# Patient Record
Sex: Male | Born: 1960 | Race: White | Hispanic: No | Marital: Married | Smoking: Current every day smoker
Health system: Southern US, Community
[De-identification: ages and names within clinical notes are randomized; demographics above are authoritative.]

## PROBLEM LIST (undated history)

## (undated) DIAGNOSIS — K449 Diaphragmatic hernia without obstruction or gangrene: Secondary | ICD-10-CM

## (undated) DIAGNOSIS — M1990 Unspecified osteoarthritis, unspecified site: Secondary | ICD-10-CM

## (undated) DIAGNOSIS — K635 Polyp of colon: Secondary | ICD-10-CM

## (undated) DIAGNOSIS — E7800 Pure hypercholesterolemia, unspecified: Secondary | ICD-10-CM

## (undated) HISTORY — DX: Pure hypercholesterolemia, unspecified: E78.00

## (undated) HISTORY — DX: Diaphragmatic hernia without obstruction or gangrene: K44.9

## (undated) HISTORY — DX: Unspecified osteoarthritis, unspecified site: M19.90

## (undated) HISTORY — PX: VASECTOMY: SHX75

## (undated) HISTORY — DX: Polyp of colon: K63.5

## (undated) HISTORY — PX: APPENDECTOMY: SHX54

## (undated) HISTORY — PX: COLONOSCOPY: SHX174

---

## 1997-09-30 ENCOUNTER — Emergency Department (HOSPITAL_COMMUNITY): Admission: EM | Admit: 1997-09-30 | Discharge: 1997-09-30 | Payer: Self-pay | Admitting: Emergency Medicine

## 2004-07-28 ENCOUNTER — Encounter: Admission: RE | Admit: 2004-07-28 | Discharge: 2004-07-28 | Payer: Self-pay | Admitting: *Deleted

## 2004-07-29 ENCOUNTER — Ambulatory Visit (HOSPITAL_COMMUNITY): Admission: RE | Admit: 2004-07-29 | Discharge: 2004-07-29 | Payer: Self-pay | Admitting: *Deleted

## 2006-07-19 ENCOUNTER — Ambulatory Visit: Payer: Self-pay | Admitting: Gastroenterology

## 2006-07-30 ENCOUNTER — Ambulatory Visit: Payer: Self-pay | Admitting: Gastroenterology

## 2006-07-30 ENCOUNTER — Encounter (INDEPENDENT_AMBULATORY_CARE_PROVIDER_SITE_OTHER): Payer: Self-pay | Admitting: Specialist

## 2006-09-06 ENCOUNTER — Ambulatory Visit: Payer: Self-pay | Admitting: Gastroenterology

## 2010-09-09 NOTE — Assessment & Plan Note (Signed)
[facility]                         GASTROENTEROLOGY OFFICE NOTE   ARTICE, BERGERSON                     MRN:          [id number]  DATE:09/06/2006                            DOB:          Sep 08, 1960    PROBLEM:  Abdominal pain.   Mr. Mason Ferguson has returned for reevaluation.  Upper endoscopy demonstrated  erosive esophagitis.  He also has gastritis.  Biopsies were positive for  H. pylori, for which he underwent therapy with a Prevpac.  After  completing his 2-week course of antibiotics, Mr. Mason Ferguson reports complete  resolution of his abdominal pain.  He currently has no GI complaints.   PHYSICAL EXAMINATION:  VITAL SIGNS:  Pulse 80, blood pressure 118/82,  weight 186.   IMPRESSION:  Abdominal pain -- secondary to gastritis and probably  secondary to erosive esophagitis.   RECOMMENDATIONS:  Mr. Mason Ferguson is reluctant to take any more medicine,  since he is symptom-free.  I carefully instructed him to resume PPI  therapy, should he have recurrent pain or reflux.     Barbette Hair. Arlyce Dice, MD,FACG  Electronically Signed    RDK/MedQ  DD: 09/06/2006  DT: 09/07/2006  Job #: [id number]

## 2010-09-12 NOTE — Cardiovascular Report (Signed)
NAMEDARNEL, MCHAN NO.:  [id number]   MEDICAL RECORD NO.:  [id number]          PATIENT TYPE:  OIB   LOCATION:  2899                         FACILITY:  [facility]   PHYSICIAN:  Meade Maw, M.D.    DATE OF BIRTH:  16-Nov-1960   DATE OF PROCEDURE:  07/29/2004  DATE OF DISCHARGE:                              CARDIAC CATHETERIZATION   INDICATIONS FOR PROCEDURE:  This is a 50 year old gentleman with ongoing  chest pain which was described as midsternal chest pain radiating to the  left arm, severe in nature.  He underwent a stress Cardiolite which revealed  minimal decreased uptake in the anteroseptal region.  There was no  significant reversal.  He had good exercise tolerance and the patient did  have chest pain with distress portion.  In view of his ongoing chest pain,  it was elected to proceed with left heart catheterization.   DESCRIPTION OF PROCEDURE:  After obtaining written informed consent, the  patient was brought to the cardiac catheterization lab in the postabsorptive  state.  Preoperative sedation was achieved using Versed 1 mg IV.  The right  groin was prepped and draped in the usual sterile fashion.  Local anesthesia  was achieved using 1% Xylocaine.  A 6 French hemostasis sheath was placed  into the right femoral artery using a modified Seldinger technique.  Selective coronary angiography was performed using a JL4 and JR4 Judkins  catheter.  Multiple views were obtained.  All catheter exchanges were made  over a guide wire.  Single plane ventriculogram was performed in the RAO  position using a 6 French pigtail curved catheter.  There was no immediate  complications.  There was no identifiable disease.  The patient was  transferred to the holding area.  The hemostasis sheath was removed.  Hemostasis was achieved using a fem-stop device.   FINDINGS:  The aortic pressure was 109/77, LV pressure was 109, EDP is 18.   Single plane ventriculogram revealed  normal wall motion, ejection fraction  of 65%.   CORONARY ANGIOGRAPHY:  1.  The left main coronary artery bifurcates into the left anterior      descending and circumflex vessel.  There is no disease noted in the left      main coronary artery.  2.  Left anterior descending gives rise to trivial D1 and D2, small D3, and      a larger D4.  The left anterior descending goes on to end as an apical      branch. There is no disease noted in the left anterior descending or its      branches.  3.  Circumflex is a large caliber vessel giving rise to trivial OM1, OM2,      there is a large OM3.  The circumflex then goes on to end as a      posterolateral branch. There is no disease noted in the circumflex or      its branches.  4.  Right coronary artery is large dominant artery giving rise to a small to      moderate RV marginal  I, marginal II.  The right coronary artery gives      rise to PDA and PL branch.  There is no disease noted in the right      coronary artery or its branches.   IMPRESSION:  Normal coronary angiography, normal single plane  ventriculogram.  Other etiologies for his chest pain should be considered.      HP/MEDQ  D:  07/29/2004  T:  07/29/2004  Job:  [id number]   cc:   Tally Joe, M.D.  [address] [place] [address]  [place], [place] [address]  Fax: [phone]

## 2014-04-27 HISTORY — PX: ROTATOR CUFF REPAIR: SHX139

## 2020-06-17 DIAGNOSIS — M542 Cervicalgia: Secondary | ICD-10-CM | POA: Diagnosis not present

## 2020-06-17 DIAGNOSIS — M25512 Pain in left shoulder: Secondary | ICD-10-CM | POA: Diagnosis not present

## 2020-06-17 DIAGNOSIS — Z8249 Family history of ischemic heart disease and other diseases of the circulatory system: Secondary | ICD-10-CM | POA: Diagnosis not present

## 2020-06-17 DIAGNOSIS — R9431 Abnormal electrocardiogram [ECG] [EKG]: Secondary | ICD-10-CM | POA: Diagnosis not present

## 2020-07-19 ENCOUNTER — Other Ambulatory Visit: Payer: Self-pay

## 2020-07-19 ENCOUNTER — Encounter: Payer: Self-pay | Admitting: Cardiology

## 2020-07-19 ENCOUNTER — Ambulatory Visit: Payer: BC Managed Care – PPO | Admitting: Cardiology

## 2020-07-19 VITALS — BP 127/76 | HR 65 | Temp 98.6°F | Resp 17 | Ht 73.0 in | Wt 175.4 lb

## 2020-07-19 DIAGNOSIS — I208 Other forms of angina pectoris: Secondary | ICD-10-CM | POA: Diagnosis not present

## 2020-07-19 DIAGNOSIS — Z8249 Family history of ischemic heart disease and other diseases of the circulatory system: Secondary | ICD-10-CM | POA: Diagnosis not present

## 2020-07-19 DIAGNOSIS — E782 Mixed hyperlipidemia: Secondary | ICD-10-CM | POA: Diagnosis not present

## 2020-07-19 DIAGNOSIS — R1900 Intra-abdominal and pelvic swelling, mass and lump, unspecified site: Secondary | ICD-10-CM

## 2020-07-19 DIAGNOSIS — R9431 Abnormal electrocardiogram [ECG] [EKG]: Secondary | ICD-10-CM | POA: Diagnosis not present

## 2020-07-19 DIAGNOSIS — F17210 Nicotine dependence, cigarettes, uncomplicated: Secondary | ICD-10-CM

## 2020-07-19 MED ORDER — BUPROPION HCL ER (SR) 150 MG PO TB12: 150.0000 mg | ORAL_TABLET | Freq: Two times a day (BID) | ORAL | 3 refills | Status: DC

## 2020-07-19 NOTE — Progress Notes (Signed)
Primary Physician/Referring:  Shanon Rosser, PA-C  Patient ID: Mason Ferguson, male    DOB: 1960/12/05, 60 y.o.   MRN: [id number]  Chief Complaint  Patient presents with  . Abnormal ECG    Ref by Shanon Rosser   HPI:    Mason Ferguson  is a 60 y.o. Caucasian male with current daily smoking, hyperlipidemia, Raynaud's disease, rheumatoid arthritis referred to me for evaluation of left arm pain on exertion suggestive of angina pectoris and abnormal EKG with T wave inversions in V1 V2 done on 06/17/2020.  Patient has family history of early CAD, father and brother had CABG in their 68s, both his father and his brother also had abdominal aortic aneurysm.   Patient underwent coronary angiogram in 2006, with normal coronary anatomy and no significant coronary artery disease.  Patient reports approximately 3 weeks ago he woke up with sudden onset left shoulder and neck pain.  Patient received no relief taking ibuprofen.  The pain persisted and he therefore went to urgent care approximately 5 hours after onset of pain.  On urgent care they reportedly told the patient he had a bone spur following a x-ray of the cervical spine, which they stated was the source of his discomfort.  Unfortunately these records are available for review.  Patient was treated by urgent care provider with muscle relaxer and Tylenol, and pain relieved approximately 24 hours later.  He has had no recurrence of left arm or neck pain in the last 1/2 weeks.  He does have right shoulder pain, which is chronic.   History reviewed. No pertinent past medical history. Past Surgical History:  Procedure Laterality Date  . APPENDECTOMY    . ROTATOR CUFF REPAIR  2016  . VASECTOMY     Family History  Problem Relation Age of Onset  . Heart attack Mother   . Stroke Mother   . Heart disease Father   . AAA (abdominal aortic aneurysm) Father   . Diabetes Sister   . Dementia Sister     Social History   Tobacco Use  . Smoking status:  Current Every Day Smoker    Packs/day: 1.00    Years: 50.00    Pack years: 50.00    Types: Cigarettes  . Smokeless tobacco: Never Used  Substance Use Topics  . Alcohol use: Not Currently   Marital Status: Married  ROS  Review of Systems  Constitutional: Negative for malaise/fatigue and weight gain.  Cardiovascular: Negative for chest pain, claudication, leg swelling, near-syncope, orthopnea, palpitations, paroxysmal nocturnal dyspnea and syncope.  Respiratory: Negative for shortness of breath.   Hematologic/Lymphatic: Does not bruise/bleed easily.  Musculoskeletal: Positive for arthritis and joint pain (right shoulder).  Gastrointestinal: Negative for melena.  Neurological: Negative for dizziness and weakness.   Objective  Blood pressure 127/76, pulse 65, temperature 98.6 F (37 C), temperature source Temporal, resp. rate 17, height 6' 1"  (1.854 m), weight 175 lb 6.4 oz (79.6 kg), SpO2 99 %.  Vitals with BMI 07/19/2020  Height 6' 1"   Weight 175 lbs 6 oz  BMI 40.98  Systolic 119  Diastolic 76  Pulse 65     Physical Exam Vitals reviewed.  HENT:     Head: Normocephalic and atraumatic.  Cardiovascular:     Rate and Rhythm: Normal rate and regular rhythm.     Pulses: Intact distal pulses.     Heart sounds: S1 normal and S2 normal. No murmur heard. No gallop.   Pulmonary:     Effort:  Pulmonary effort is normal. No respiratory distress.     Breath sounds: No wheezing, rhonchi or rales.  Abdominal:     Palpations: There is pulsatile mass.  Musculoskeletal:     Right lower leg: No edema.     Left lower leg: No edema.  Neurological:     General: No focal deficit present.     Mental Status: He is alert and oriented to person, place, and time.  Psychiatric:        Mood and Affect: Mood normal.        Behavior: Behavior normal.    Laboratory examination:   No results for input(s): NA, K, CL, CO2, GLUCOSE, BUN, CREATININE, CALCIUM, GFRNONAA, GFRAA in the last 8760  hours. CrCl cannot be calculated (No successful lab value found.).  No flowsheet data found. No flowsheet data found.  Lipid Panel No results for input(s): CHOL, TRIG, LDLCALC, VLDL, HDL, CHOLHDL, LDLDIRECT in the last 8760 hours. Lipid Panel  No results found for: CHOL, TRIG, HDL, CHOLHDL, VLDL, LDLCALC, LDLDIRECT, LABVLDL   HEMOGLOBIN A1C No results found for: HGBA1C, MPG TSH No results for input(s): TSH in the last 8760 hours.  External labs:   None  Medications and allergies  No Known Allergies   No outpatient medications prior to visit.   No facility-administered medications prior to visit.    Radiology:   No results found.   Cardiac Studies:   Coronary angiogram 07/29/2004:  1.  The left main coronary artery bifurcates into the left anterior        descending and circumflex vessel.  There is no disease noted in the left      main coronary artery.  2.  Left anterior descending gives rise to trivial D1 and D2, small D3, and      a larger D4.  The left anterior descending goes on to end as an apical      branch. There is no disease noted in the left anterior descending or its      branches.  3.  Circumflex is a large caliber vessel giving rise to trivial OM1, OM2,      there is a large OM3.  The circumflex then goes on to end as a      posterolateral branch. There is no disease noted in the circumflex or      its branches.  4.  Right coronary artery is large dominant artery giving rise to a small to      moderate RV marginal I, marginal II.  The right coronary artery gives      rise to PDA and PL branch.  There is no disease noted in the right      coronary artery or its branches.   IMPRESSION:  Normal coronary angiography, normal single plane  ventriculogram.  Other etiologies for his chest pain should be considered.  EKG:   07/19/2020: Sinus rhythm at a rate of 63 bpm.  Left atrial enlargement.  Normal axis.  T wave inversion in V1, likely normal variant.    05/28/2020: Normal sinus rhythm with rate of 65 bpm, normal axis, T wave inversion in V1 and V2, probably normal variant.  Assessment     ICD-10-CM   1. Anginal equivalent (HCC)  I20.8 PCV ECHOCARDIOGRAM COMPLETE    PCV MYOCARDIAL PERFUSION WO LEXISCAN    CMP14+EGFR    TSH    CBC  2. Family history of early CAD  Z97.49   3. Tobacco dependence due to cigarettes  F17.210   4. Pulsatile abdominal mass  R19.00 PCV AORTA DUPLEX  5. Nonspecific abnormal electrocardiogram (ECG) (EKG)  R94.31 EKG 12-Lead  6. Mixed hyperlipidemia  E78.2 Lipid Panel With LDL/HDL Ratio     There are no discontinued medications.  Meds ordered this encounter  Medications  . buPROPion (WELLBUTRIN SR) 150 MG 12 hr tablet    Sig: Take 1 tablet (150 mg total) by mouth 2 (two) times daily. 150 mg once daily for 3 days, then increase to twice daily. Plan for target quit date, 1 week following starting bupropion.    Dispense:  60 tablet    Refill:  3   Orders Placed This Encounter  Procedures  . CMP14+EGFR  . Lipid Panel With LDL/HDL Ratio  . TSH  . CBC  . PCV MYOCARDIAL PERFUSION WO LEXISCAN    Standing Status:   Future    Standing Expiration Date:   09/18/2020  . EKG 12-Lead  . PCV ECHOCARDIOGRAM COMPLETE    Standing Status:   Future    Standing Expiration Date:   07/19/2021    Recommendations:   Mason Ferguson is a 60 y.o. Caucasian male with current daily smoking, hyperlipidemia, Raynaud's disease, rheumatoid arthritis referred to me for evaluation of left arm pain on exertion suggestive of angina pectoris and abnormal EKG with T wave inversions in V1 V2 done on 06/17/2020.  Patient has family history of early CAD, father and brother had CABG in their 67s, both his father and his brother also had abdominal aortic aneurysm.   Patient underwent coronary angiogram in 2006, with normal coronary anatomy and no significant coronary artery disease.  Patient's symptoms of left arm pain and neck pain, are  suggestive of musculoskeletal etiology, however patient has multiple cardiovascular risk factors including current tobacco use, hyperlipidemia, family history of early CAD.  We will therefore schedule patient for echocardiogram and nuclear stress test.  On exam patient also has pulsatile abdominal mass, in view of this as well as patient's family history of abdominal aortic aneurysm, will obtain aortic duplex.  Patient has no recent labs for review as he does not follow regularly with PCP or other providers.  We will therefore obtain basic labs including CMP, TSH, CBC, lipid profile testing.  Patient has highly motivated to stop smoking, he is used bupropion in the past and requests that he try this again.  I have therefore prescribed bupropion for smoking cessation.  Counseled patient regarding use as well as side effects, patient will notify our office if he experiences any issues.  Blood pressure is well controlled.  Patient was referred with concerns of abnormal EKG revealing T wave inversions in V1 and V2 on 06/17/2020.  EKG today does show T wave inversions in V1, this is probably normal variant.  Follow-up in 8 weeks for results of cardiac testing.  Patient was seen in collaboration with Dr. Einar Gip. He also reviewed patient's chart and examined the patient. Dr. Einar Gip is in agreement of the plan.     Alethia Berthold, PA-C 07/22/2020, 10:20 AM Office: [phone]

## 2020-07-29 ENCOUNTER — Ambulatory Visit: Payer: BC Managed Care – PPO

## 2020-07-29 ENCOUNTER — Other Ambulatory Visit: Payer: Self-pay

## 2020-07-29 DIAGNOSIS — I208 Other forms of angina pectoris: Secondary | ICD-10-CM

## 2020-08-05 NOTE — Progress Notes (Signed)
Please inform patient stress test was low risk, will discuss further at next office visit.

## 2020-08-06 NOTE — Progress Notes (Signed)
Called and spoke with patient regarding his stress test results.

## 2020-08-21 ENCOUNTER — Ambulatory Visit: Payer: BC Managed Care – PPO

## 2020-08-21 ENCOUNTER — Other Ambulatory Visit: Payer: BC Managed Care – PPO

## 2020-08-21 ENCOUNTER — Other Ambulatory Visit: Payer: Self-pay

## 2020-08-21 DIAGNOSIS — E782 Mixed hyperlipidemia: Secondary | ICD-10-CM | POA: Diagnosis not present

## 2020-08-21 DIAGNOSIS — I208 Other forms of angina pectoris: Secondary | ICD-10-CM | POA: Diagnosis not present

## 2020-08-21 DIAGNOSIS — R1900 Intra-abdominal and pelvic swelling, mass and lump, unspecified site: Secondary | ICD-10-CM

## 2020-08-22 LAB — CMP14+EGFR
ALT: 8 IU/L (ref 0–44)
AST: 13 IU/L (ref 0–40)
Albumin/Globulin Ratio: 1.9 (ref 1.2–2.2)
Albumin: 4.9 g/dL — ABNORMAL HIGH (ref 3.8–4.8)
Alkaline Phosphatase: 144 IU/L — ABNORMAL HIGH (ref 44–121)
BUN/Creatinine Ratio: 11 (ref 10–24)
BUN: 11 mg/dL (ref 8–27)
Bilirubin Total: 0.2 mg/dL (ref 0.0–1.2)
CO2: 24 mmol/L (ref 20–29)
Calcium: 9.7 mg/dL (ref 8.6–10.2)
Chloride: 103 mmol/L (ref 96–106)
Creatinine, Ser: 1.01 mg/dL (ref 0.76–1.27)
Globulin, Total: 2.6 g/dL (ref 1.5–4.5)
Glucose: 134 mg/dL — ABNORMAL HIGH (ref 65–99)
Potassium: 4.9 mmol/L (ref 3.5–5.2)
Sodium: 139 mmol/L (ref 134–144)
Total Protein: 7.5 g/dL (ref 6.0–8.5)
eGFR: 85 mL/min/{1.73_m2} (ref >59)

## 2020-08-22 LAB — CBC
Hematocrit: 48.4 % (ref 37.5–51.0)
Hemoglobin: 16.2 g/dL (ref 13.0–17.7)
MCH: 32.1 pg (ref 26.6–33.0)
MCHC: 33.5 g/dL (ref 31.5–35.7)
MCV: 96 fL (ref 79–97)
Platelets: 201 x10E3/uL (ref 150–450)
RBC: 5.05 x10E6/uL (ref 4.14–5.80)
RDW: 12.7 % (ref 11.6–15.4)
WBC: 10.8 x10E3/uL (ref 3.4–10.8)

## 2020-08-22 LAB — LIPID PANEL WITH LDL/HDL RATIO
Cholesterol, Total: 247 mg/dL — ABNORMAL HIGH (ref 100–199)
HDL: 29 mg/dL — ABNORMAL LOW
LDL Chol Calc (NIH): 181 mg/dL — ABNORMAL HIGH (ref 0–99)
LDL/HDL Ratio: 6.2 ratio — ABNORMAL HIGH (ref 0.0–3.6)
Triglycerides: 196 mg/dL — ABNORMAL HIGH (ref 0–149)
VLDL Cholesterol Cal: 37 mg/dL (ref 5–40)

## 2020-08-22 LAB — TSH: TSH: 1.72 u[IU]/mL (ref 0.450–4.500)

## 2020-08-22 NOTE — Progress Notes (Signed)
Please advise patient CBC and TSH normal.  Renal function electrolytes normal.  Cholesterol is elevated, advised patient to reduce intake of fatty foods.  Please also forward copy of CMP to PCP.

## 2020-08-23 NOTE — Progress Notes (Signed)
Called and spoke with patient regarding his lab results.

## 2020-08-27 ENCOUNTER — Other Ambulatory Visit: Payer: BC Managed Care – PPO

## 2020-08-28 ENCOUNTER — Ambulatory Visit: Payer: BC Managed Care – PPO

## 2020-08-28 ENCOUNTER — Other Ambulatory Visit: Payer: Self-pay

## 2020-08-28 DIAGNOSIS — I208 Other forms of angina pectoris: Secondary | ICD-10-CM | POA: Diagnosis not present

## 2020-08-28 DIAGNOSIS — I2089 Other forms of angina pectoris: Secondary | ICD-10-CM

## 2020-09-02 ENCOUNTER — Telehealth: Payer: Self-pay

## 2020-09-02 NOTE — Telephone Encounter (Signed)
For patient this may be due to process of getting subxiphoid imaging during echocardiogram, could also be due to underlying reflux or GERD. Please advise patient to continue to monitor this and notify us if it gets worse. Also remind him to keep follow up appt with [place].

## 2020-09-02 NOTE — Progress Notes (Signed)
Please inform patient echocardiogram revealed normal pumping activity of the heart, with grade 2 diastolic dysfunction which we will discuss further at upcoming office visit.

## 2020-09-02 NOTE — Telephone Encounter (Signed)
Called pt and pt is aware and understands

## 2020-09-03 NOTE — Progress Notes (Signed)
Called pt to inform him about his echo results. Pt understood

## 2020-09-13 ENCOUNTER — Ambulatory Visit: Payer: BC Managed Care – PPO | Admitting: Student

## 2020-09-18 NOTE — Progress Notes (Signed)
Primary Physician/Referring:  Lindaann Pascal, PA-C  Patient ID: Mason Ferguson, male    DOB: 1960/08/23, 60 y.o.   MRN: [id number]  Chief Complaint  Patient presents with  . Results    Echocardiogram, stress test, aortic duplex  . Follow-up    8 weeks   HPI:    Mason Ferguson  is a 60 y.o. Caucasian male with current daily smoking, hyperlipidemia, Raynaud's disease, rheumatoid arthritis originally referred to me for evaluation of left arm pain on exertion suggestive of angina pectoris and abnormal EKG with T wave inversions in V1 V2 done on 06/17/2020.  Patient has family history of early CAD, father and brother had CABG in their 77s, both his father and his brother also had abdominal aortic aneurysm.   Patient underwent coronary angiogram in 2006, with normal coronary anatomy and no significant coronary artery disease.  Patient presents for 8-week follow-up for results of cardiac testing.  Since last visit patient underwent echocardiogram, nuclear stress test, aortic duplex, and lab work.  Review of recent lab testing revealed normal CBC and TSH.  CMP showed mildly elevated liver enzymes, which I recommend patient follow-up with PCP regarding.  Lipid profile testing revealed lipids are not controlled.  Echocardiogram revealed normal LVEF with grade 2 diastolic dysfunction, stress test was low risk, and aortic duplex showed abdominal aortic aneurysm measuring 3 cm.  Patient reports he has had no recurrence of left shoulder or neck pain.  In fact he has been relatively asymptomatic since last visit.  Denies chest pain, palpitations, syncope, near syncope, dizziness, dyspnea.  History reviewed. No pertinent past medical history. Past Surgical History:  Procedure Laterality Date  . APPENDECTOMY    . ROTATOR CUFF REPAIR  2016  . VASECTOMY     Family History  Problem Relation Age of Onset  . Heart attack Mother   . Stroke Mother   . Heart disease Father   . AAA (abdominal aortic  aneurysm) Father   . Diabetes Sister   . Dementia Sister     Social History   Tobacco Use  . Smoking status: Current Every Day Smoker    Packs/day: 1.00    Years: 50.00    Pack years: 50.00    Types: Cigarettes  . Smokeless tobacco: Never Used  Substance Use Topics  . Alcohol use: Not Currently   Marital Status: Married  ROS  Review of Systems  Constitutional: Negative for malaise/fatigue and weight gain.  Cardiovascular: Negative for chest pain, claudication, leg swelling, near-syncope, orthopnea, palpitations, paroxysmal nocturnal dyspnea and syncope.  Respiratory: Negative for shortness of breath.   Hematologic/Lymphatic: Does not bruise/bleed easily.  Musculoskeletal: Positive for arthritis and joint pain (right shoulder, improved).  Gastrointestinal: Negative for melena.  Neurological: Negative for dizziness and weakness.   Objective  Blood pressure 121/74, pulse 61, temperature 97.7 F (36.5 C), temperature source Temporal, resp. rate 16, height 6\' 1"  (1.854 m), weight 176 lb 6.4 oz (80 kg), SpO2 97 %.  Vitals with BMI 09/19/2020 07/19/2020  Height 6\' 1"  6\' 1"   Weight 176 lbs 6 oz 175 lbs 6 oz  BMI 23.28 23.15  Systolic 121 127  Diastolic 74 76  Pulse 61 65     Physical Exam Vitals reviewed.  HENT:     Head: Normocephalic and atraumatic.  Cardiovascular:     Rate and Rhythm: Normal rate and regular rhythm.     Pulses: Intact distal pulses.     Heart sounds: S1 normal and S2 normal.  No murmur heard. No gallop.   Pulmonary:     Effort: Pulmonary effort is normal. No respiratory distress.     Breath sounds: No wheezing, rhonchi or rales.  Abdominal:     Palpations: There is pulsatile mass.  Musculoskeletal:     Right lower leg: No edema.     Left lower leg: No edema.  Neurological:     General: No focal deficit present.     Mental Status: He is alert and oriented to person, place, and time.  Psychiatric:        Mood and Affect: Mood normal.         Behavior: Behavior normal.    Laboratory examination:   Recent Labs    08/21/20 0723  NA 139  K 4.9  CL 103  CO2 24  GLUCOSE 134*  BUN 11  CREATININE 1.01  CALCIUM 9.7   CrCl cannot be calculated (Patient's most recent lab result is older than the maximum 21 days allowed.).  CMP Latest Ref Rng & Units 08/21/2020  Glucose 65 - 99 mg/dL 354(T)  BUN 8 - 27 mg/dL 11  Creatinine 6.25 - 6.38 mg/dL 9.37  Sodium 342 - 876 mmol/L 139  Potassium 3.5 - 5.2 mmol/L 4.9  Chloride 96 - 106 mmol/L 103  CO2 20 - 29 mmol/L 24  Calcium 8.6 - 10.2 mg/dL 9.7  Total Protein 6.0 - 8.5 g/dL 7.5  Total Bilirubin 0.0 - 1.2 mg/dL 0.2  Alkaline Phos 44 - 121 IU/L 144(H)  AST 0 - 40 IU/L 13  ALT 0 - 44 IU/L 8   CBC Latest Ref Rng & Units 08/21/2020  WBC 3.4 - 10.8 x10E3/uL 10.8  Hemoglobin 13.0 - 17.7 g/dL 81.1  Hematocrit 57.2 - 51.0 % 48.4  Platelets 150 - 450 x10E3/uL 201    Lipid Panel Recent Labs    08/21/20 0723  CHOL 247*  TRIG 196*  LDLCALC 181*  HDL 29*   Lipid Panel     Component Value Date/Time   CHOL 247 (H) 08/21/2020 0723   TRIG 196 (H) 08/21/2020 0723   HDL 29 (L) 08/21/2020 0723   LDLCALC 181 (H) 08/21/2020 0723   LABVLDL 37 08/21/2020 0723     HEMOGLOBIN A1C No results found for: HGBA1C, MPG TSH Recent Labs    08/21/20 0723  TSH 1.720    External labs:   None  Medications and allergies  No Known Allergies   Outpatient Medications Prior to Visit  Medication Sig  . buPROPion (WELLBUTRIN SR) 150 MG 12 hr tablet Take 1 tablet (150 mg total) by mouth 2 (two) times daily. 150 mg once daily for 3 days, then increase to twice daily. Plan for target quit date, 1 week following starting bupropion.   No facility-administered medications prior to visit.    Radiology:   No results found.   Cardiac Studies:   Coronary angiogram 07/29/2004:  1.  The left main coronary artery bifurcates into the left anterior        descending and circumflex vessel.  There  is no disease noted in the left      main coronary artery.  2.  Left anterior descending gives rise to trivial D1 and D2, small D3, and      a larger D4.  The left anterior descending goes on to end as an apical      branch. There is no disease noted in the left anterior descending or its      branches.  3.  Circumflex is a large caliber vessel giving rise to trivial OM1, OM2,      there is a large OM3.  The circumflex then goes on to end as a      posterolateral branch. There is no disease noted in the circumflex or      its branches.  4.  Right coronary artery is large dominant artery giving rise to a small to      moderate RV marginal I, marginal II.  The right coronary artery gives      rise to PDA and PL branch.  There is no disease noted in the right      coronary artery or its branches.   IMPRESSION:  Normal coronary angiography, normal single plane  ventriculogram.  Other etiologies for his chest pain should be considered.  PCV MYOCARDIAL PERFUSION WO LEXISCAN 07/29/2020 Exercise nuclear stress test was performed using Bruce protocol. Patient reached 10.6 METS, and 86% of age predicted maximum heart rate. Exercise capacity was excellent. Chest pain not reported. Heart rate and hemodynamic response were normal. Stress EKG revealed no ischemic changes. Normal myocardial perfusion. Stress LVEF 67%. Low risk study.  Abdominal Aortic Duplex 08/21/2020:  Mild plaque noted in the mid and distal aorta.  An abdominal aortic aneurysm measuring 3 x 2.52 x 2.61 cm is seen in the mid and distal abdominal aorta.  Normal iliac artery size and velocity.  Recheck in 3 years.   PCV ECHOCARDIOGRAM COMPLETE 08/28/2020 Left ventricle cavity is normal in size. Mild concentric hypertrophy of the left ventricle. Normal global wall motion. Normal LV systolic function with EF 55%. Doppler evidence of grade II (pseudonormal) diastolic dysfunction, elevated LAP. Calculated EF 55%. Left atrial cavity is  mildly dilated. Trace mitral and tricuspid regurgitation. Unable to calculate RV systolic pressure due to inadequate TR jet. Estimated right atrial pressure 8 mmHg.   EKG:   07/19/2020: Sinus rhythm at a rate of 63 bpm.  Left atrial enlargement.  Normal axis.  T wave inversion in V1, likely normal variant.   05/28/2020: Normal sinus rhythm with rate of 65 bpm, normal axis, T wave inversion in V1 and V2, probably normal variant.  Assessment     ICD-10-CM   1. AAA (abdominal aortic aneurysm) without rupture (HCC)  I71.4 Basic metabolic panel    Hgb A1c w/o eAG  2. Hypercholesterolemia  E78.00 Hgb A1c w/o eAG    Lipid Panel With LDL/HDL Ratio  3. Family history of early CAD  Z82.49 Hgb A1c w/o eAG    Lipid Panel With LDL/HDL Ratio  4. Tobacco dependence due to cigarettes  F17.210      There are no discontinued medications.  Meds ordered this encounter  Medications  . losartan (COZAAR) 25 MG tablet    Sig: Take 1 tablet (25 mg total) by mouth daily.    Dispense:  90 tablet    Refill:  3  . atorvastatin (LIPITOR) 40 MG tablet    Sig: Take 1 tablet (40 mg total) by mouth daily.    Dispense:  90 tablet    Refill:  3   Orders Placed This Encounter  Procedures  . Basic metabolic panel  . Hgb A1c w/o eAG  . Lipid Panel With LDL/HDL Ratio    Standing Status:   Future    Standing Expiration Date:   09/19/2021    Recommendations:   Mason Ferguson is a 60 y.o. Caucasian male with current daily smoking, hyperlipidemia, Raynaud's disease, rheumatoid arthritis  referred to me for evaluation of left arm pain on exertion suggestive of angina pectoris and abnormal EKG with T wave inversions in V1 V2 done on 06/17/2020.  Patient has family history of early CAD, father and brother had CABG in their 59s, both his father and his brother also had abdominal aortic aneurysm.   Patient underwent coronary angiogram in 2006, with normal coronary anatomy and no significant coronary artery  disease.  Patient presents for 8-week follow-up for results of cardiac testing.  Since last visit patient underwent echocardiogram, nuclear stress test, aortic duplex, and lab work.  Review of recent lab testing revealed normal CBC and TSH.  CMP showed mildly elevated liver enzymes, which I recommend patient follow-up with PCP regarding.  Lipid profile testing revealed lipids are not controlled.  Echocardiogram revealed normal LVEF with grade 2 diastolic dysfunction, stress test was low risk, and aortic duplex showed abdominal aortic aneurysm measuring 3 cm.   Reviewed and discussed with patient regarding results of echocardiogram, stress test, aortic duplex, and laboratory testing, details above.  Patient's blood pressure is well controlled.  Lipids are uncontrolled, and given patient's cardiovascular risk factors only history recommend aggressive lipid control.  We will start atorvastatin 40 mg nightly.  We will repeat lipid profile testing in 3 months.  In regard to T wave inversions on EKG this is likely normal variant given that it is stable and nuclear stress test and echocardiogram are reassuring.  Given abdominal aortic aneurysm we will plan to repeat surveillance scan in April 2025.  We will also start patient on losartan 25 mg daily in order to maintain good blood pressure control as well as due to underlying aortic disease.  We will repeat BMP in 1 week.  We will also obtain hemoglobin A1c as it appears PCP has not done this, would like to further stratify patient from a cardiovascular standpoint.  Unfortunately patient continues to smoke approximately a pack per day.  5 minutes were spent on tobacco cessation counseling during today's visit.  He has not been taking Wellbutrin but is willing to start at this time.  Gust at length with patient regarding modifiable cardiovascular risk factors, he verbalized understanding and appears motivated to make lifestyle modifications.  Follow-up in 3  months, sooner if needed, for hyperlipidemia and cardiovascular risk management.   Rayford Halsted, PA-C 09/19/2020, 3:37 PM Office: [phone]

## 2020-09-19 ENCOUNTER — Ambulatory Visit: Payer: BC Managed Care – PPO | Admitting: Student

## 2020-09-19 ENCOUNTER — Other Ambulatory Visit: Payer: Self-pay

## 2020-09-19 ENCOUNTER — Encounter: Payer: Self-pay | Admitting: Student

## 2020-09-19 VITALS — BP 121/74 | HR 61 | Temp 97.7°F | Resp 16 | Ht 73.0 in | Wt 176.4 lb

## 2020-09-19 DIAGNOSIS — E7800 Pure hypercholesterolemia, unspecified: Secondary | ICD-10-CM

## 2020-09-19 DIAGNOSIS — Z8249 Family history of ischemic heart disease and other diseases of the circulatory system: Secondary | ICD-10-CM | POA: Diagnosis not present

## 2020-09-19 DIAGNOSIS — F17210 Nicotine dependence, cigarettes, uncomplicated: Secondary | ICD-10-CM | POA: Diagnosis not present

## 2020-09-19 DIAGNOSIS — I714 Abdominal aortic aneurysm, without rupture, unspecified: Secondary | ICD-10-CM

## 2020-09-19 MED ORDER — LOSARTAN POTASSIUM 25 MG PO TABS: 25.0000 mg | ORAL_TABLET | Freq: Every day | ORAL | 3 refills | Status: AC

## 2020-09-19 MED ORDER — ATORVASTATIN CALCIUM 40 MG PO TABS: 40.0000 mg | ORAL_TABLET | Freq: Every day | ORAL | 3 refills | Status: AC

## 2020-09-28 DIAGNOSIS — M62830 Muscle spasm of back: Secondary | ICD-10-CM | POA: Diagnosis not present

## 2020-10-21 ENCOUNTER — Other Ambulatory Visit: Payer: Self-pay | Admitting: Student

## 2020-11-10 DIAGNOSIS — Z125 Encounter for screening for malignant neoplasm of prostate: Secondary | ICD-10-CM | POA: Diagnosis not present

## 2020-11-10 DIAGNOSIS — I10 Essential (primary) hypertension: Secondary | ICD-10-CM | POA: Diagnosis not present

## 2020-11-16 DIAGNOSIS — N529 Male erectile dysfunction, unspecified: Secondary | ICD-10-CM | POA: Diagnosis not present

## 2020-11-16 DIAGNOSIS — Z0000 Encounter for general adult medical examination without abnormal findings: Secondary | ICD-10-CM | POA: Diagnosis not present

## 2020-11-16 DIAGNOSIS — F17210 Nicotine dependence, cigarettes, uncomplicated: Secondary | ICD-10-CM | POA: Diagnosis not present

## 2020-11-16 DIAGNOSIS — I10 Essential (primary) hypertension: Secondary | ICD-10-CM | POA: Diagnosis not present

## 2020-11-30 ENCOUNTER — Other Ambulatory Visit: Payer: Self-pay | Admitting: Student

## 2020-12-25 ENCOUNTER — Ambulatory Visit: Payer: BC Managed Care – PPO | Admitting: Student

## 2020-12-25 DIAGNOSIS — L820 Inflamed seborrheic keratosis: Secondary | ICD-10-CM | POA: Diagnosis not present

## 2020-12-30 DIAGNOSIS — T63441A Toxic effect of venom of bees, accidental (unintentional), initial encounter: Secondary | ICD-10-CM | POA: Diagnosis not present

## 2020-12-30 DIAGNOSIS — L03113 Cellulitis of right upper limb: Secondary | ICD-10-CM | POA: Diagnosis not present

## 2020-12-30 DIAGNOSIS — T7840XA Allergy, unspecified, initial encounter: Secondary | ICD-10-CM | POA: Diagnosis not present

## 2021-01-17 ENCOUNTER — Ambulatory Visit: Payer: BC Managed Care – PPO | Admitting: Student

## 2021-02-21 ENCOUNTER — Ambulatory Visit: Payer: BC Managed Care – PPO | Admitting: Student

## 2021-02-21 ENCOUNTER — Other Ambulatory Visit: Payer: Self-pay

## 2021-02-21 ENCOUNTER — Encounter: Payer: Self-pay | Admitting: Student

## 2021-02-21 VITALS — BP 144/83 | HR 72 | Temp 98.5°F | Ht 73.0 in | Wt 176.0 lb

## 2021-02-21 DIAGNOSIS — E7800 Pure hypercholesterolemia, unspecified: Secondary | ICD-10-CM

## 2021-02-21 DIAGNOSIS — F17200 Nicotine dependence, unspecified, uncomplicated: Secondary | ICD-10-CM

## 2021-02-21 DIAGNOSIS — R030 Elevated blood-pressure reading, without diagnosis of hypertension: Secondary | ICD-10-CM | POA: Diagnosis not present

## 2021-02-21 DIAGNOSIS — I208 Other forms of angina pectoris: Secondary | ICD-10-CM | POA: Diagnosis not present

## 2021-02-21 NOTE — Progress Notes (Signed)
Primary Physician/Referring:  Lindaann Pascal, PA-C  Patient ID: Mason Ferguson, male    DOB: [birth year], 60 y.o.   MRN: [id number]  Chief Complaint  Patient presents with   Hyperlipidemia   Follow-up   HPI:    Mason Ferguson  is a 61 y.o. Caucasian male with current daily smoking, hyperlipidemia, Raynaud's disease, rheumatoid arthritis originally referred to me for evaluation of left arm pain on exertion suggestive of angina pectoris and abnormal EKG with T wave inversions in V1 V2 done on 06/17/2020.  Patient has family history of early CAD, father and brother had CABG in their 45s, both his father and his brother also had abdominal aortic aneurysm.   Patient underwent coronary angiogram in 2006, with normal coronary anatomy and no significant coronary artery disease.  Patient presents for 62-month follow-up.  At last office visit started losartan as well as atorvastatin, however patient admits to difficulty with medication compliance, often missing doses for several days and around.  He is otherwise feeling well, and remains active.  Denies chest pain, palpitations, dyspnea.  History reviewed. No pertinent past medical history. Past Surgical History:  Procedure Laterality Date   APPENDECTOMY     ROTATOR CUFF REPAIR  2016   VASECTOMY     Family History  Problem Relation Age of Onset   Heart attack Mother    Stroke Mother    Heart disease Father    AAA (abdominal aortic aneurysm) Father    Diabetes Sister    Dementia Sister     Social History   Tobacco Use   Smoking status: Every Day    Packs/day: 1.00    Years: 50.00    Pack years: 50.00    Types: Cigarettes   Smokeless tobacco: Never  Substance Use Topics   Alcohol use: Not Currently   Marital Status: Married  ROS  Review of Systems  Cardiovascular:  Negative for chest pain, claudication, leg swelling, near-syncope, orthopnea, palpitations, paroxysmal nocturnal dyspnea and syncope.  Respiratory:  Negative for  shortness of breath.   Neurological:  Negative for dizziness.  Objective  Blood pressure (!) 144/83, pulse 72, temperature 98.5 F (36.9 C), temperature source Temporal, height 6\' 1"  (1.854 m), weight 176 lb (79.8 kg), SpO2 98 %.  Vitals with BMI 02/21/2021 09/19/2020 07/19/2020  Height 6\' 1"  6\' 1"  6\' 1"   Weight 176 lbs 176 lbs 6 oz 175 lbs 6 oz  BMI 23.23 23.28 23.15  Systolic 144 121 07/21/2020  Diastolic 83 74 76  Pulse 72 61 65     Physical Exam Vitals reviewed.  HENT:     Head: Normocephalic and atraumatic.  Cardiovascular:     Rate and Rhythm: Normal rate and regular rhythm.     Pulses: Intact distal pulses.     Heart sounds: S1 normal and S2 normal. No murmur heard.   No gallop.  Pulmonary:     Effort: Pulmonary effort is normal. No respiratory distress.     Breath sounds: No wheezing, rhonchi or rales.  Abdominal:     Palpations: There is pulsatile mass.  Musculoskeletal:     Right lower leg: No edema.     Left lower leg: No edema.  Neurological:     General: No focal deficit present.     Mental Status: He is alert and oriented to person, place, and time.  Psychiatric:        Mood and Affect: Mood normal.        Behavior: Behavior normal.  Physical exam remains unchanged compared to last office visit.  Laboratory examination:   Recent Labs    08/21/20 0723  NA 139  K 4.9  CL 103  CO2 24  GLUCOSE 134*  BUN 11  CREATININE 1.01  CALCIUM 9.7   CrCl cannot be calculated (Patient's most recent lab result is older than the maximum 21 days allowed.).  CMP Latest Ref Rng & Units 08/21/2020  Glucose 65 - 99 mg/dL 161(W)  BUN 8 - 27 mg/dL 11  Creatinine 9.60 - 4.54 mg/dL 0.98  Sodium 119 - 147 mmol/L 139  Potassium 3.5 - 5.2 mmol/L 4.9  Chloride 96 - 106 mmol/L 103  CO2 20 - 29 mmol/L 24  Calcium 8.6 - 10.2 mg/dL 9.7  Total Protein 6.0 - 8.5 g/dL 7.5  Total Bilirubin 0.0 - 1.2 mg/dL 0.2  Alkaline Phos 44 - 121 IU/L 144(H)  AST 0 - 40 IU/L 13  ALT 0 - 44 IU/L 8    CBC Latest Ref Rng & Units 08/21/2020  WBC 3.4 - 10.8 x10E3/uL 10.8  Hemoglobin 13.0 - 17.7 g/dL 82.9  Hematocrit 56.2 - 51.0 % 48.4  Platelets 150 - 450 x10E3/uL 201    Lipid Panel Recent Labs    08/21/20 0723  CHOL 247*  TRIG 196*  LDLCALC 181*  HDL 29*   Lipid Panel     Component Value Date/Time   CHOL 247 (H) 08/21/2020 0723   TRIG 196 (H) 08/21/2020 0723   HDL 29 (L) 08/21/2020 0723   LDLCALC 181 (H) 08/21/2020 0723   LABVLDL 37 08/21/2020 0723     HEMOGLOBIN A1C No results found for: HGBA1C, MPG TSH Recent Labs    08/21/20 0723  TSH 1.720    External labs:  None  Allergies  No Known Allergies  Medications Prior to Visit:   Outpatient Medications Prior to Visit  Medication Sig Dispense Refill   atorvastatin (LIPITOR) 40 MG tablet Take 1 tablet (40 mg total) by mouth daily. 90 tablet 3   losartan (COZAAR) 25 MG tablet Take 1 tablet (25 mg total) by mouth daily. 90 tablet 3   buPROPion (WELLBUTRIN SR) 150 MG 12 hr tablet PLEASE SEE ATTACHED FOR DETAILED DIRECTIONS (Patient not taking: Reported on 02/21/2021) 180 tablet 2   No facility-administered medications prior to visit.   Final Medications at End of Visit    Current Meds  Medication Sig   atorvastatin (LIPITOR) 40 MG tablet Take 1 tablet (40 mg total) by mouth daily.   losartan (COZAAR) 25 MG tablet Take 1 tablet (25 mg total) by mouth daily.   Radiology:   No results found.  Cardiac Studies:   Coronary angiogram 07/29/2004:  1.  The left main coronary artery bifurcates into the left anterior        descending and circumflex vessel.  There is no disease noted in the left      main coronary artery.  2.  Left anterior descending gives rise to trivial D1 and D2, small D3, and      a larger D4.  The left anterior descending goes on to end as an apical      branch. There is no disease noted in the left anterior descending or its      branches.  3.  Circumflex is a large caliber vessel  giving rise to trivial OM1, OM2,      there is a large OM3.  The circumflex then goes on to end as a  posterolateral branch. There is no disease noted in the circumflex or      its branches.  4.  Right coronary artery is large dominant artery giving rise to a small to      moderate RV marginal I, marginal II.  The right coronary artery gives      rise to PDA and PL branch.  There is no disease noted in the right      coronary artery or its branches.    IMPRESSION:  Normal coronary angiography, normal single plane  ventriculogram.  Other etiologies for his chest pain should be considered.  PCV MYOCARDIAL PERFUSION WO LEXISCAN 07/29/2020 Exercise nuclear stress test was performed using Bruce protocol. Patient reached 10.6 METS, and 86% of age predicted maximum heart rate. Exercise capacity was excellent. Chest pain not reported. Heart rate and hemodynamic response were normal. Stress EKG revealed no ischemic changes. Normal myocardial perfusion. Stress LVEF 67%. Low risk study.  Abdominal Aortic Duplex 08/21/2020:  Mild plaque noted in the mid and distal aorta.   An abdominal aortic aneurysm measuring 3 x 2.52 x 2.61 cm is seen in the mid and distal abdominal aorta.  Normal iliac artery size and velocity.  Recheck in 3 years.   PCV ECHOCARDIOGRAM COMPLETE 08/28/2020 Left ventricle cavity is normal in size. Mild concentric hypertrophy of the left ventricle. Normal global wall motion. Normal LV systolic function with EF 55%. Doppler evidence of grade II (pseudonormal) diastolic dysfunction, elevated LAP. Calculated EF 55%. Left atrial cavity is mildly dilated. Trace mitral and tricuspid regurgitation. Unable to calculate RV systolic pressure due to inadequate TR jet. Estimated right atrial pressure 8 mmHg.   EKG:  02/21/2021: Sinus rhythm at a rate of 71 bpm, normal axis.  No evidence of ischemia or underlying injury pattern.  07/19/2020: Sinus rhythm at a rate of 63 bpm.  Left atrial  enlargement.  Normal axis.  T wave inversion in V1, likely normal variant.   05/28/2020: Normal sinus rhythm with rate of 65 bpm, normal axis, T wave inversion in V1 and V2, probably normal variant.  Assessment     ICD-10-CM   1. Hypercholesterolemia  E78.00 EKG 12-Lead    2. Elevated blood pressure reading in office without diagnosis of hypertension  R03.0 EKG 12-Lead    3. Nicotine dependence with current use  F17.200 EKG 12-Lead       There are no discontinued medications.  No orders of the defined types were placed in this encounter.  Orders Placed This Encounter  Procedures   EKG 12-Lead    Recommendations:   GOVERNOR MATOS is a 60 y.o. Caucasian male with current daily smoking, hyperlipidemia, Raynaud's disease, rheumatoid arthritis referred to me for evaluation of left arm pain on exertion suggestive of angina pectoris and abnormal EKG with T wave inversions in V1 V2 done on 06/17/2020.  Patient has family history of early CAD, father and brother had CABG in their 46s, both his father and his brother also had abdominal aortic aneurysm.   Patient underwent coronary angiogram in 2006, with normal coronary anatomy and no significant coronary artery disease.  Patient presents for 15-month follow-up.  Given small abdominal aortic aneurysm we will plan to repeat surveillance scan in April 2025.  Patient has not been compliant with losartan and statin therapy.  Therefore stressed the importance of medication compliance, patient appears motivated.  Blood pressure is elevated in the office today. He will restart losartan and atorvastatin.  We will repeat BMP in 1  week and lipid profile testing in 2 to 3 months.  Patient is otherwise stable from a cardiovascular standpoint.  Unfortunately he does continue to smoke approximately 1 pack/day.  Spent 4 minutes of today's office visit counseling regarding tobacco cessation, however patient is not motivated to quit.  Follow-up in 6 months,  sooner if needed.    Rayford Halsted, PA-C 02/21/2021, 3:38 PM Office: [phone]

## 2021-09-26 ENCOUNTER — Ambulatory Visit: Payer: Self-pay

## 2021-09-26 ENCOUNTER — Other Ambulatory Visit: Payer: Self-pay | Admitting: Nurse Practitioner

## 2021-09-26 DIAGNOSIS — M5441 Lumbago with sciatica, right side: Secondary | ICD-10-CM

## 2021-09-26 DIAGNOSIS — M25551 Pain in right hip: Secondary | ICD-10-CM

## 2021-12-28 DIAGNOSIS — S63641A Sprain of metacarpophalangeal joint of right thumb, initial encounter: Secondary | ICD-10-CM | POA: Diagnosis not present

## 2022-02-08 DIAGNOSIS — N509 Disorder of male genital organs, unspecified: Secondary | ICD-10-CM | POA: Diagnosis not present

## 2022-02-08 DIAGNOSIS — Z0000 Encounter for general adult medical examination without abnormal findings: Secondary | ICD-10-CM | POA: Diagnosis not present

## 2022-02-08 DIAGNOSIS — G4701 Insomnia due to medical condition: Secondary | ICD-10-CM | POA: Diagnosis not present

## 2022-02-08 DIAGNOSIS — R519 Headache, unspecified: Secondary | ICD-10-CM | POA: Diagnosis not present

## 2022-02-08 DIAGNOSIS — Z13228 Encounter for screening for other metabolic disorders: Secondary | ICD-10-CM | POA: Diagnosis not present

## 2022-02-08 DIAGNOSIS — Z125 Encounter for screening for malignant neoplasm of prostate: Secondary | ICD-10-CM | POA: Diagnosis not present

## 2022-02-08 DIAGNOSIS — Z1211 Encounter for screening for malignant neoplasm of colon: Secondary | ICD-10-CM | POA: Diagnosis not present

## 2022-02-27 DIAGNOSIS — N50811 Right testicular pain: Secondary | ICD-10-CM | POA: Diagnosis not present

## 2022-02-27 DIAGNOSIS — A5131 Condyloma latum: Secondary | ICD-10-CM | POA: Diagnosis not present

## 2022-02-27 DIAGNOSIS — N5201 Erectile dysfunction due to arterial insufficiency: Secondary | ICD-10-CM | POA: Diagnosis not present

## 2022-03-05 DIAGNOSIS — R519 Headache, unspecified: Secondary | ICD-10-CM | POA: Diagnosis not present

## 2022-03-21 ENCOUNTER — Ambulatory Visit
Admission: EM | Admit: 2022-03-21 | Discharge: 2022-03-21 | Disposition: A | Discharge status: Home / Self Care | Payer: Worker's Compensation

## 2022-03-21 ENCOUNTER — Ambulatory Visit (INDEPENDENT_AMBULATORY_CARE_PROVIDER_SITE_OTHER): Payer: Self-pay

## 2022-03-21 DIAGNOSIS — M545 Low back pain, unspecified: Secondary | ICD-10-CM

## 2022-03-21 DIAGNOSIS — S39012A Strain of muscle, fascia and tendon of lower back, initial encounter: Secondary | ICD-10-CM | POA: Diagnosis not present

## 2022-03-21 MED ORDER — ACETAMINOPHEN 325 MG PO TABS: 650.0000 mg | ORAL_TABLET | Freq: Four times a day (QID) | ORAL | 0 refills | Status: AC | PRN

## 2022-03-21 MED ORDER — PREDNISONE 50 MG PO TABS: 50.0000 mg | ORAL_TABLET | Freq: Every day | ORAL | 0 refills | Status: AC

## 2022-03-21 MED ORDER — TIZANIDINE HCL 4 MG PO TABS: 4.0000 mg | ORAL_TABLET | Freq: Every day | ORAL | 0 refills | Status: AC

## 2022-03-21 NOTE — ED Provider Notes (Signed)
[name]  Note:  This document was prepared using Conservation officer, historic buildings and may include unintentional dictation errors.  MRN: [id number] DOB: Jul 04, 1960  Subjective:   Mason Ferguson is a 61 y.o. male presenting for 1 day history of acute onset persistent severe low back pain from an injury at work.  States that he slipped and twisted his back, had a back pack sprayer, lost his footing and his leg slipped under him.  Pain is sharp, constant, limits his range of motion and is rated 10 out of 10.  Symptoms were progressive as he initially did not have pain but was substantial when he went to bed. No fall, trauma, numbness or tingling, saddle paresthesia, changes to bowel or urinary habits, radicular symptoms. He took 1 left over hydrocodone which did not help.   No current facility-administered medications for this encounter.  Current Outpatient Medications:    atorvastatin (LIPITOR) 40 MG tablet, Take 1 tablet (40 mg total) by mouth daily., Disp: 90 tablet, Rfl: 3   buPROPion (WELLBUTRIN SR) 150 MG 12 hr tablet, PLEASE SEE ATTACHED FOR DETAILED DIRECTIONS (Patient not taking: Reported on 02/21/2021), Disp: 180 tablet, Rfl: 2   losartan (COZAAR) 25 MG tablet, Take 1 tablet (25 mg total) by mouth daily., Disp: 90 tablet, Rfl: 3   No Known Allergies  No past medical history on file.   Past Surgical History:  Procedure Laterality Date   APPENDECTOMY     ROTATOR CUFF REPAIR  2016   VASECTOMY      Family History  Problem Relation Age of Onset   Heart attack Mother    Stroke Mother    Heart disease Father    AAA (abdominal aortic aneurysm) Father    Diabetes Sister    Dementia Sister     Social History   Tobacco Use   Smoking status: Every Day    Packs/day: 1.00    Years: 50.00    Total pack years: 50.00    Types: Cigarettes   Smokeless tobacco: Never  Vaping Use   Vaping Use: Never used  Substance Use Topics   Alcohol use: Not  Currently   Drug use: Never    ROS   Objective:   Vitals: BP 133/80 (BP Location: Right Arm)   Pulse 73   Temp 97.8 F (36.6 C) (Oral)   Resp 16   SpO2 96%   Physical Exam Constitutional:      General: He is not in acute distress.    Appearance: Normal appearance. He is well-developed and normal weight. He is not ill-appearing, toxic-appearing or diaphoretic.  HENT:     Head: Normocephalic and atraumatic.     Right Ear: External ear normal.     Left Ear: External ear normal.     Nose: Nose normal.     Mouth/Throat:     Pharynx: Oropharynx is clear.  Eyes:     General: No scleral icterus.       Right eye: No discharge.        Left eye: No discharge.     Extraocular Movements: Extraocular movements intact.  Cardiovascular:     Rate and Rhythm: Normal rate.  Pulmonary:     Effort: Pulmonary effort is normal.  Musculoskeletal:     Cervical back: Normal range of motion.     Lumbar back: Spasms and tenderness (over areas outlined) present. No swelling, edema, deformity, signs of trauma, lacerations or bony tenderness. Decreased range of motion.  Negative right straight leg raise test and negative left straight leg raise test. No scoliosis.       Back:  Neurological:     Mental Status: He is alert and oriented to person, place, and time.  Psychiatric:        Mood and Affect: Mood normal.        Behavior: Behavior normal.        Thought Content: Thought content normal.        Judgment: Judgment normal.     Assessment and Plan :   I have reviewed the PDMP during this encounter.  1. Acute bilateral low back pain without sciatica   2. Lumbar strain, initial encounter     Given severity of his pain, recommended a oral prednisone course at 50 mg for 5 days.  Use Tylenol and tizanidine for supportive care. X-ray over-read was pending at time of discharge, recommended follow up with only abnormal results. Otherwise will not call for negative over-read. Patient was in  agreement.   Otherwise, discussed general nature of lumbar strain and provided anticipatory guidance.  Patient continues to have symptoms Monday morning, recommended he report to occupational health. Counseled patient on potential for adverse effects with medications prescribed/recommended today, ER and return-to-clinic precautions discussed, patient verbalized understanding.    Wallis Bamberg, [place] 03/21/22 [phone]

## 2022-03-21 NOTE — ED Triage Notes (Signed)
Pt states he was working yesterday when he slipped/twisted-pain to mid/lower back-grimacing/slow gait

## 2022-03-26 ENCOUNTER — Encounter: Payer: Self-pay | Admitting: Gastroenterology

## 2022-05-04 ENCOUNTER — Ambulatory Visit: Payer: Self-pay | Admitting: Gastroenterology

## 2022-06-03 ENCOUNTER — Other Ambulatory Visit: Payer: Self-pay | Admitting: Orthopedic Surgery

## 2022-06-03 DIAGNOSIS — M5440 Lumbago with sciatica, unspecified side: Secondary | ICD-10-CM

## 2022-06-26 ENCOUNTER — Ambulatory Visit: Payer: Self-pay | Admitting: Gastroenterology

## 2022-07-07 ENCOUNTER — Ambulatory Visit
Admission: RE | Admit: 2022-07-07 | Discharge: 2022-07-07 | Disposition: A | Discharge status: Home / Self Care | Payer: Worker's Compensation | Source: Ambulatory Visit | Attending: Orthopedic Surgery | Admitting: Orthopedic Surgery

## 2022-07-07 ENCOUNTER — Ambulatory Visit
Admission: RE | Admit: 2022-07-07 | Discharge: 2022-07-07 | Disposition: A | Discharge status: Home / Self Care | Payer: Self-pay | Source: Ambulatory Visit | Attending: Orthopedic Surgery | Admitting: Orthopedic Surgery

## 2022-07-07 DIAGNOSIS — M5440 Lumbago with sciatica, unspecified side: Secondary | ICD-10-CM

## 2022-08-12 ENCOUNTER — Ambulatory Visit: Payer: BC Managed Care – PPO | Admitting: Gastroenterology

## 2022-08-12 ENCOUNTER — Encounter: Payer: Self-pay | Admitting: Gastroenterology

## 2022-08-12 VITALS — BP 118/68 | HR 68 | Ht 73.0 in | Wt 175.0 lb

## 2022-08-12 DIAGNOSIS — Z86010 Personal history of colonic polyps: Secondary | ICD-10-CM

## 2022-08-12 DIAGNOSIS — Z1211 Encounter for screening for malignant neoplasm of colon: Secondary | ICD-10-CM

## 2022-08-12 MED ORDER — NA SULFATE-K SULFATE-MG SULF 17.5-3.13-1.6 GM/177ML PO SOLN: 1.0000 | Freq: Once | ORAL | 0 refills | Status: AC

## 2022-08-12 NOTE — Patient Instructions (Signed)
_______________________________________________________  If your blood pressure at your visit was 140/90 or greater, please contact your primary care physician to follow up on this.  _______________________________________________________  If you are age 64 or older, your body mass index should be between 23-30. Your Body mass index is 23.09 kg/m. If this is out of the aforementioned range listed, please consider follow up with your Primary Care Provider.  If you are age 65 or younger, your body mass index should be between 19-25. Your Body mass index is 23.09 kg/m. If this is out of the aformentioned range listed, please consider follow up with your Primary Care Provider.   You have been scheduled for a colonoscopy. Please follow written instructions given to you at your visit today.  Please pick up your prep supplies at the pharmacy within the next 1-3 days. If you use inhalers (even only as needed), please bring them with you on the day of your procedure.   ________________________________________________________  The [facility] providers would like to encourage you to use [facility] to communicate with providers for non-urgent requests or questions.  Due to long hold times on the telephone, sending your provider a message by [facility] may be a faster and more efficient way to get a response.  Please allow 48 business hours for a response.  Please remember that this is for non-urgent requests.   It was a pleasure to see you today!  Thank you for trusting me with your gastrointestinal care!    Scott E.Tomasa Rand, MD

## 2022-08-12 NOTE — Progress Notes (Signed)
HPI : Mason Ferguson is a very pleasant 62 year old male with a history of arthritis, hiatal hernia and high cholesterol who is referred to [place] by Lindaann Pascal, PA for consideration of surveillance colonoscopy.  He reports having a colonoscopy in [place] in 2015.  He believes that he had 1 polyp removed.  He had a flexible sigmoidoscopy performed at our office in 1995 to evaluate rectal bleeding which was reportedly unremarkable (procedure report not available). He denies any significant chronic lower GI symptoms.  He typically has 1 or 2 bowel movements per day.  No problems with abdominal pain.  No blood in the stool.  His stool is often on the loose/poorly formed side.  Sometimes he has urgency.  No straining. His weight is stable.  Colonoscopy 2015 [facility]) report not available 1 polyp removed, per patient  Flex Sig 1995 (report not available)  EGD 2008 (abdominal pain) Gastritis, esophagitis  Past Medical History:  Diagnosis Date   Arthritis    Colon polyps    Hiatal hernia    High cholesterol      Past Surgical History:  Procedure Laterality Date   APPENDECTOMY     ROTATOR CUFF REPAIR  2016   VASECTOMY     Family History  Problem Relation Age of Onset   Heart attack Mother    Stroke Mother    Heart disease Father    AAA (abdominal aortic aneurysm) Father    Diabetes Sister    Dementia Sister    Esophageal cancer Neg Hx    Colon cancer Neg Hx    Liver disease Neg Hx    Social History   Tobacco Use   Smoking status: Every Day    Packs/day: 1.00    Years: 50.00    Additional pack years: 0.00    Total pack years: 50.00    Types: Cigarettes   Smokeless tobacco: Never  Vaping Use   Vaping Use: Never used  Substance Use Topics   Alcohol use: Yes    Comment: occ   Drug use: Never   Current Outpatient Medications  Medication Sig Dispense Refill   acetaminophen (TYLENOL) 325 MG tablet Take 2 tablets (650 mg total) by mouth every 6 (six) hours as needed for moderate  pain. 30 tablet 0   diclofenac (VOLTAREN) 50 MG EC tablet Take 50 mg by mouth 2 (two) times daily.     tiZANidine (ZANAFLEX) 4 MG tablet Take 1 tablet (4 mg total) by mouth at bedtime. 30 tablet 0   atorvastatin (LIPITOR) 40 MG tablet Take 1 tablet (40 mg total) by mouth daily. 90 tablet 3   buPROPion (WELLBUTRIN SR) 150 MG 12 hr tablet PLEASE SEE ATTACHED FOR DETAILED DIRECTIONS (Patient not taking: Reported on 02/21/2021) 180 tablet 2   HYDROcodone-acetaminophen (NORCO) 7.5-325 MG tablet Take 1 tablet by mouth every 6 (six) hours as needed for moderate pain. (Patient not taking: Reported on 08/12/2022)     losartan (COZAAR) 25 MG tablet Take 1 tablet (25 mg total) by mouth daily. 90 tablet 3   predniSONE (DELTASONE) 50 MG tablet Take 1 tablet (50 mg total) by mouth daily with breakfast. (Patient not taking: Reported on 08/12/2022) 5 tablet 0   No current facility-administered medications for this visit.   No Known Allergies   Review of Systems: All systems reviewed and negative except where noted in HPI.    No results found.  Physical Exam: BP 118/68   Pulse 68   Ht  (1.854 m)  Wt 175 lb (79.4 kg)   BMI 23.09 kg/m  Constitutional: Pleasant,well-developed, Caucasian male in no acute distress.  Accompanied by wife HEENT: Normocephalic and atraumatic. Conjunctivae are normal. No scleral icterus. Neck supple.  Cardiovascular: Normal rate, regular rhythm.  Pulmonary/chest: Effort normal and breath sounds normal. No wheezing, rales or rhonchi. Abdominal: Soft, nondistended, nontender. Bowel sounds active throughout. There are no masses palpable. No hepatomegaly. Extremities: no edema Neurological: Alert and oriented to person place and time. Skin: Skin is warm and dry. No rashes noted. Psychiatric: Normal mood and affect. Behavior is normal.  CBC    Component Value Date/Time   WBC 10.8 08/21/2020 0723   RBC 5.05 08/21/2020 0723   HGB 16.2 08/21/2020 0723   HCT 48.4  08/21/2020 0723   PLT 201 08/21/2020 0723   MCV 96 08/21/2020 0723   MCH 32.1 08/21/2020 0723   MCHC 33.5 08/21/2020 0723   RDW 12.7 08/21/2020 0723    CMP     Component Value Date/Time   NA 139 08/21/2020 0723   K 4.9 08/21/2020 0723   CL 103 08/21/2020 0723   CO2 24 08/21/2020 0723   GLUCOSE 134 (H) 08/21/2020 0723   BUN 11 08/21/2020 0723   CREATININE 1.01 08/21/2020 0723   CALCIUM 9.7 08/21/2020 0723   PROT 7.5 08/21/2020 0723   ALBUMIN 4.9 (H) 08/21/2020 0723   AST 13 08/21/2020 0723   ALT 8 08/21/2020 0723   ALKPHOS 144 (H) 08/21/2020 0723   BILITOT 0.2 08/21/2020 0723     ASSESSMENT AND PLAN:  62 year old male with reported history of polyp (details not available) in 2015.  Will schedule patient for colonoscopy for/polyp surveillance.  No family history of colon cancer.  No concerning GI symptoms.  No concerning cardiopulmonary comorbidities or symptoms.  CRC screening/history of polyps - Colonoscopy  The details, risks (including bleeding, perforation, infection, missed lesions, medication reactions and possible hospitalization or surgery if complications occur), benefits, and alternatives to colonoscopy with possible biopsy and possible polypectomy were discussed with the patient and he consents to proceed.   Hershal Eriksson E. Tomasa Rand, MD [facility]    [place], [place], [place]

## 2022-08-25 ENCOUNTER — Encounter: Payer: Self-pay | Admitting: Gastroenterology

## 2022-09-04 ENCOUNTER — Ambulatory Visit (AMBULATORY_SURGERY_CENTER): Payer: BC Managed Care – PPO | Admitting: Gastroenterology

## 2022-09-04 ENCOUNTER — Encounter: Payer: Self-pay | Admitting: Gastroenterology

## 2022-09-04 VITALS — BP 125/79 | HR 65 | Temp 98.9°F | Resp 14 | Ht 73.0 in | Wt 175.0 lb

## 2022-09-04 DIAGNOSIS — Z09 Encounter for follow-up examination after completed treatment for conditions other than malignant neoplasm: Secondary | ICD-10-CM | POA: Diagnosis present

## 2022-09-04 DIAGNOSIS — K635 Polyp of colon: Secondary | ICD-10-CM | POA: Diagnosis not present

## 2022-09-04 DIAGNOSIS — D128 Benign neoplasm of rectum: Secondary | ICD-10-CM

## 2022-09-04 DIAGNOSIS — D120 Benign neoplasm of cecum: Secondary | ICD-10-CM | POA: Diagnosis not present

## 2022-09-04 DIAGNOSIS — D125 Benign neoplasm of sigmoid colon: Secondary | ICD-10-CM

## 2022-09-04 DIAGNOSIS — Z86010 Personal history of colonic polyps: Secondary | ICD-10-CM | POA: Diagnosis not present

## 2022-09-04 MED ORDER — SODIUM CHLORIDE 0.9 % IV SOLN: 500.0000 mL | Freq: Once | INTRAVENOUS | Status: DC

## 2022-09-04 NOTE — Progress Notes (Deleted)
Called to room to assist during endoscopic procedure.  Patient ID and intended procedure confirmed with present staff. Received instructions for my participation in the procedure from the performing physician.  

## 2022-09-04 NOTE — Progress Notes (Signed)
A and O x3. Report to RN. Tolerated MAC anesthesia well. 

## 2022-09-04 NOTE — Progress Notes (Signed)
History and Physical Interval Note:  09/04/2022 4:15 PM  Mason Ferguson  has presented today for endoscopic procedure(s), with the diagnosis of  Encounter Diagnosis  Name Primary?   History of colon polyps Yes  .  The various methods of evaluation and treatment have been discussed with the patient and/or family. After consideration of risks, benefits and other options for treatment, the patient has consented to  the endoscopic procedure(s).   The patient's history has been reviewed, patient examined, no change in status, stable for endoscopic procedure(s).  I have reviewed the patient's chart and labs.  Questions were answered to the patient's satisfaction.     Rennae Ferraiolo E. Tomasa Rand, MD [facility]astroenterology

## 2022-09-04 NOTE — Op Note (Signed)
[facility] Patient Name: Mason Ferguson Procedure Date: 09/04/2022 4:01 PM MRN: [id number] Endoscopist: Lorin Picket E. Tomasa Rand , MD, [id number] Age: 62 Referring MD:  Date of Birth: Jul 27, 1960 Gender: Male Account #: [id number] Procedure:                Colonoscopy Indications:              Surveillance: Personal history of colonic polyps                            (unknown histology) on last colonoscopy more than 5                            years ago Medicines:                Monitored Anesthesia Care Procedure:                Pre-Anesthesia Assessment:                           - Prior to the procedure, a History and Physical                            was performed, and patient medications and                            allergies were reviewed. The patient's tolerance of                            previous anesthesia was also reviewed. The risks                            and benefits of the procedure and the sedation                            options and risks were discussed with the patient.                            All questions were answered, and informed consent                            was obtained. Prior Anticoagulants: The patient has                            taken no anticoagulant or antiplatelet agents. ASA                            Grade Assessment: II - A patient with mild systemic                            disease. After reviewing the risks and benefits,                            the patient was deemed in satisfactory condition to  undergo the procedure.                           After obtaining informed consent, the colonoscope                            was passed under direct vision. Throughout the                            procedure, the patient's blood pressure, pulse, and                            oxygen saturations were monitored continuously. The                            CF [id number] #[id number] was introduced through  the anus                            and advanced to the the terminal ileum, with                            identification of the appendiceal orifice and IC                            valve. The colonoscopy was performed without                            difficulty. The patient tolerated the procedure                            well. The quality of the bowel preparation was                            adequate. The terminal ileum, ileocecal valve,                            appendiceal orifice, and rectum were photographed.                            The bowel preparation used was SUPREP via split                            dose instruction. Scope In: 4:30:20 PM Scope Out: 4:56:34 PM Scope Withdrawal Time: 0 hours 19 minutes 44 seconds  Total Procedure Duration: 0 hours 26 minutes 14 seconds  Findings:                 The perianal and digital rectal examinations were                            normal. Pertinent negatives include normal                            sphincter tone and no palpable rectal lesions.  A 4 mm polyp was found in the cecum. The polyp was                            sessile. The polyp was removed with a cold snare.                            Resection and retrieval were complete. Estimated                            blood loss was minimal.                           A 10 mm polyp was found in the sigmoid colon. The                            polyp was sessile. The polyp was removed with a                            cold snare. Resection and retrieval were complete.                            Estimated blood loss was minimal.                           Many sessile polyps were found in the rectum. The                            polyps were small in size. Two of these polyps were                            removed with a cold snare. Resection and retrieval                            were complete. Estimated blood loss was minimal.                            Multiple medium-mouthed and small-mouthed                            diverticula were found in the sigmoid colon and                            descending colon.                           The exam was otherwise normal throughout the                            examined colon.                           The terminal ileum appeared normal.  The retroflexed view of the distal rectum and anal                            verge was normal and showed no anal or rectal                            abnormalities. Complications:            No immediate complications. Estimated Blood Loss:     Estimated blood loss was minimal. Impression:               - One 4 mm polyp in the cecum, removed with a cold                            snare. Resected and retrieved.                           - One 10 mm polyp in the sigmoid colon, removed                            with a cold snare. Resected and retrieved.                           - Many small polyps in the rectum, removed with a                            cold snare. Resected and retrieved. These were                            consistent with hyperplastic polyps                           - Moderate diverticulosis in the sigmoid colon and                            in the descending colon.                           - The examined portion of the ileum was normal.                           - The distal rectum and anal verge are normal on                            retroflexion view. Recommendation:           - Patient has a contact number available for                            emergencies. The signs and symptoms of potential                            delayed complications were discussed with the  patient. Return to normal activities tomorrow.                            Written discharge instructions were provided to the                            patient.                           - Resume  previous diet.                           - Continue present medications.                           - Await pathology results.                           - Repeat colonoscopy (date not yet determined) for                            surveillance based on pathology results. Hildagard Sobecki E. Tomasa Rand, MD 09/04/2022 5:03:45 PM This report has been signed electronically.

## 2022-09-04 NOTE — Progress Notes (Unsigned)
Called to room to assist during endoscopic procedure.  Patient ID and intended procedure confirmed with present staff. Received instructions for my participation in the procedure from the performing physician.  

## 2022-09-04 NOTE — Patient Instructions (Signed)
-  Handout on polyps, diverticulosis provided -await pathology results -repeat colonoscopy for surveillance recommended. Date to be determined when pathology result become available   -Continue present medications   YOU HAD AN ENDOSCOPIC PROCEDURE TODAY AT THE [facility]:   Refer to the procedure report that was given to you for any specific questions about what was found during the examination.  If the procedure report does not answer your questions, please call your gastroenterologist to clarify.  If you requested that your care partner not be given the details of your procedure findings, then the procedure report has been included in a sealed envelope for you to review at your convenience later.  YOU SHOULD EXPECT: Some feelings of bloating in the abdomen. Passage of more gas than usual.  Walking can help get rid of the air that was put into your GI tract during the procedure and reduce the bloating. If you had a lower endoscopy (such as a colonoscopy or flexible sigmoidoscopy) you may notice spotting of blood in your stool or on the toilet paper. If you underwent a bowel prep for your procedure, you may not have a normal bowel movement for a few days.  Please Note:  You might notice some irritation and congestion in your nose or some drainage.  This is from the oxygen used during your procedure.  There is no need for concern and it should clear up in a day or so.  SYMPTOMS TO REPORT IMMEDIATELY:  Following lower endoscopy (colonoscopy or flexible sigmoidoscopy):  Excessive amounts of blood in the stool  Significant tenderness or worsening of abdominal pains  Swelling of the abdomen that is new, acute  Fever of 100F or higher   For urgent or emergent issues, a gastroenterologist can be reached at any hour by calling [phone]. Do not use MyChart messaging for urgent concerns.    DIET:  We do recommend a small meal at first, but then you may proceed to your regular diet.   Drink plenty of fluids but you should avoid alcoholic beverages for 24 hours.  ACTIVITY:  You should plan to take it easy for the rest of today and you should NOT DRIVE or use heavy machinery until tomorrow (because of the sedation medicines used during the test).    FOLLOW UP: Our staff will call the number listed on your records the next business day following your procedure.  We will call around 7:15- 8:00 am to check on you and address any questions or concerns that you may have regarding the information given to you following your procedure. If we do not reach you, we will leave a message.     If any biopsies were taken you will be contacted by phone or by letter within the next 1-3 weeks.  Please call us at [phone] if you have not heard about the biopsies in 3 weeks.    SIGNATURES/CONFIDENTIALITY: You and/or your care partner have signed paperwork which will be entered into your electronic medical record.  These signatures attest to the fact that that the information above on your After Visit Summary has been reviewed and is understood.  Full responsibility of the confidentiality of this discharge information lies with you and/or your care-partner.   

## 2022-09-07 ENCOUNTER — Telehealth: Payer: Self-pay

## 2022-09-07 NOTE — Telephone Encounter (Signed)
  Follow up Call-     09/04/2022    3:14 PM  Call back number  Post procedure Call Back phone  # [phone]  Permission to leave phone message Yes     Patient questions:  Do you have a fever, pain , or abdominal swelling? No. Pain Score  0 *  Have you tolerated food without any problems? Yes.    Have you been able to return to your normal activities? Yes.    Do you have any questions about your discharge instructions: Diet   No. Medications  No. Follow up visit  No.  Do you have questions or concerns about your Care? No.  Actions: * If pain score is 4 or above: No action needed, pain <4.

## 2022-09-13 ENCOUNTER — Encounter: Payer: Self-pay | Admitting: Gastroenterology

## 2023-02-17 IMAGING — DX DG HIP (WITH OR WITHOUT PELVIS) 2-3V*R*
3 series · 3 of 3 positions shown · non-contrast
Comparison: None Available.

CLINICAL DATA: Right hip pain after fall.

EXAM:
DG HIP (WITH OR WITHOUT PELVIS) 2-3V RIGHT

[pelvis ap]
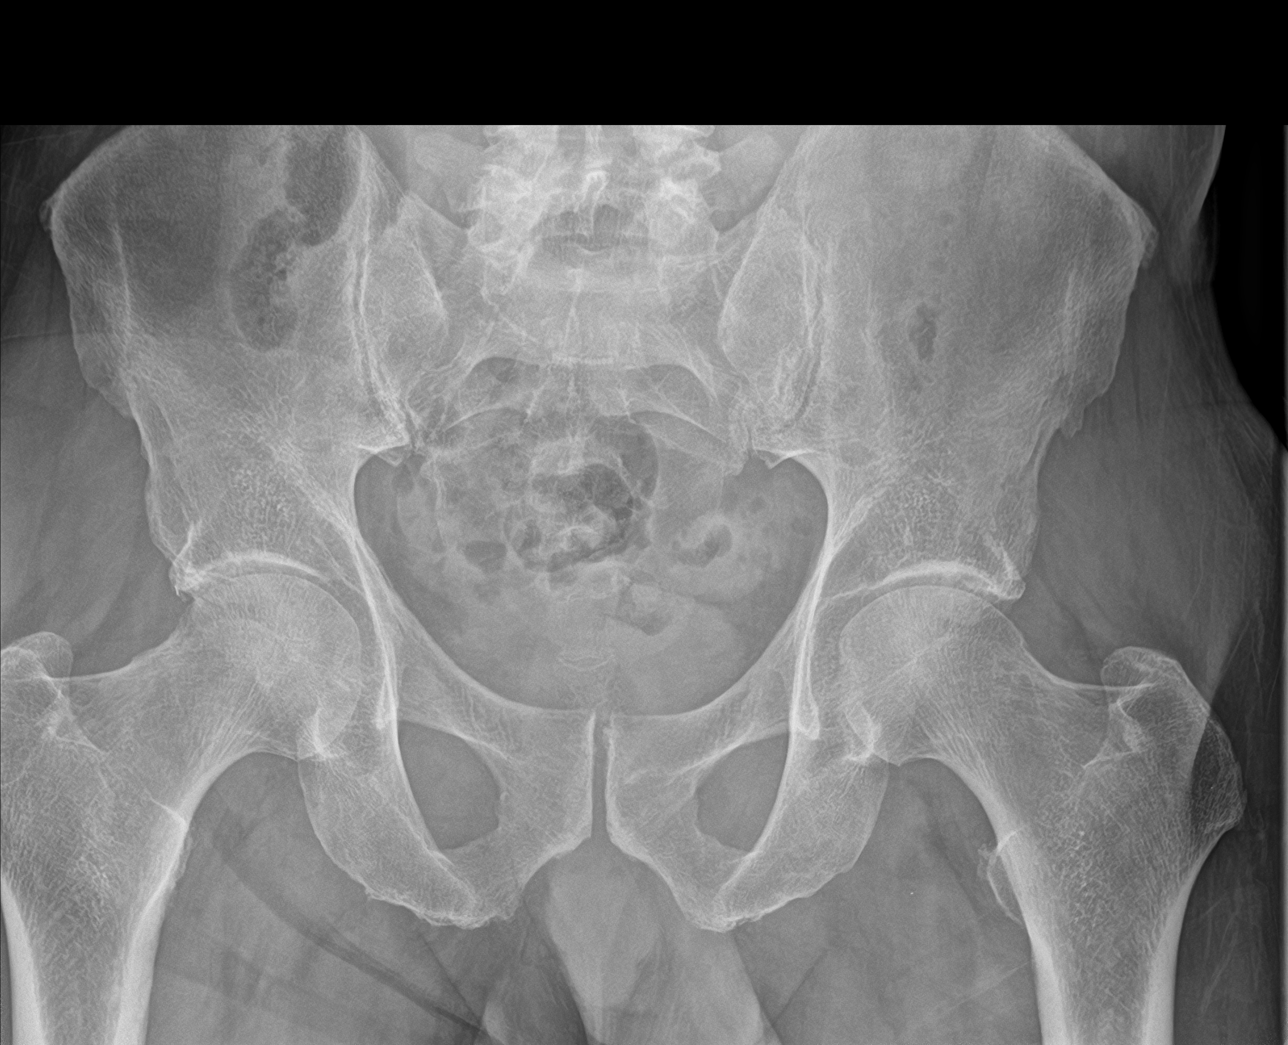

[hip ap]
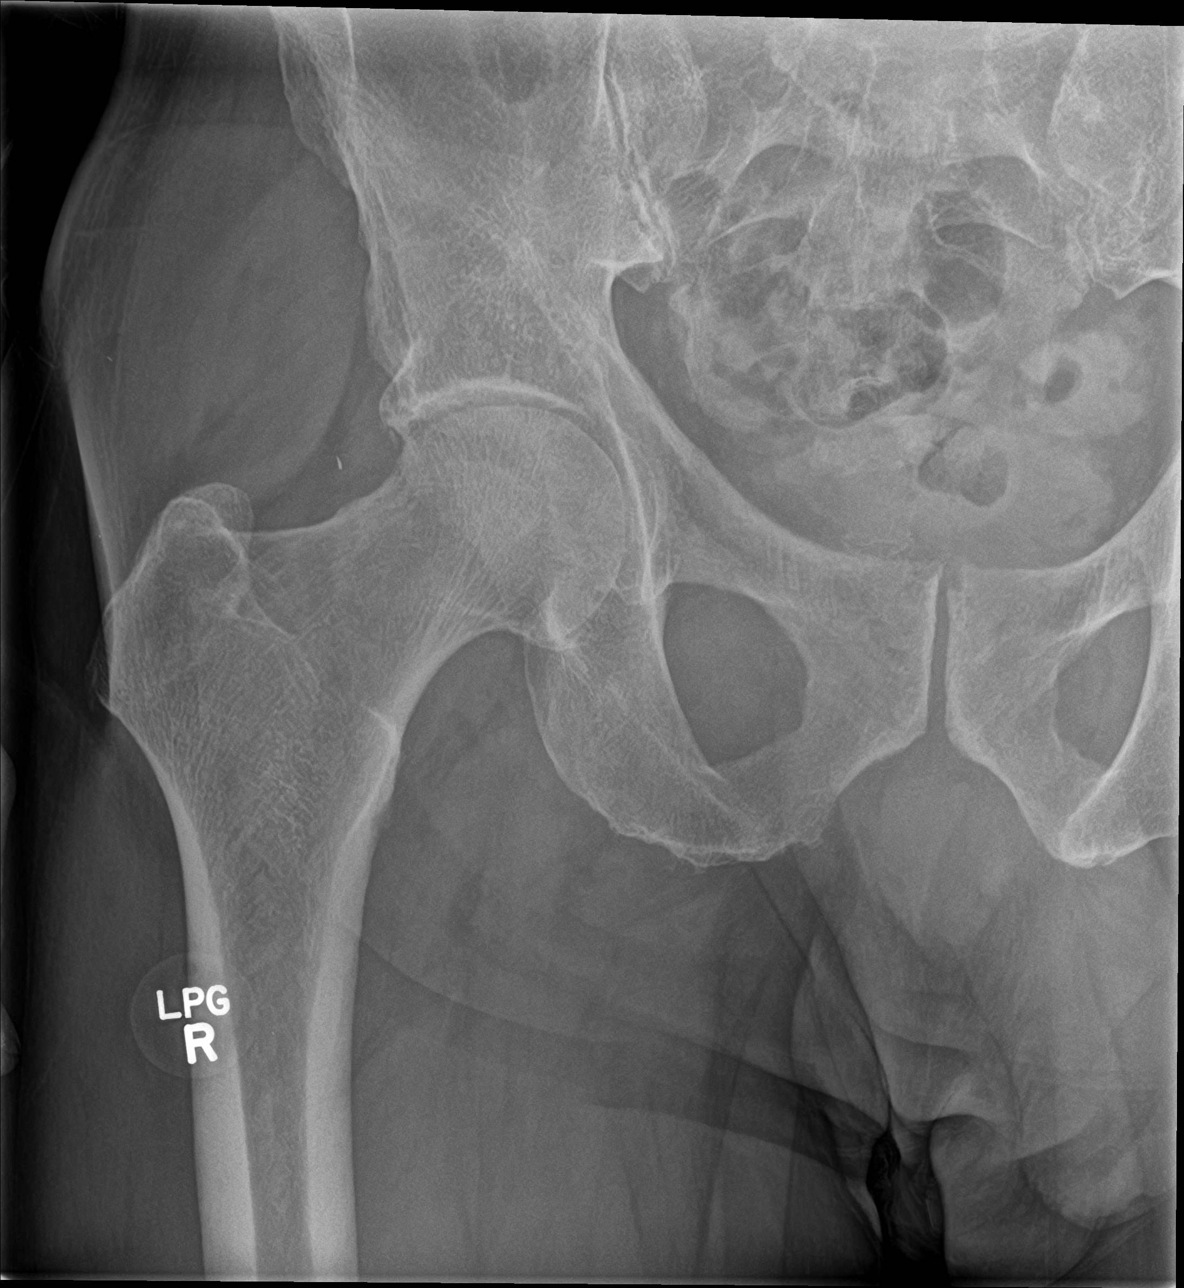

[hip lat]
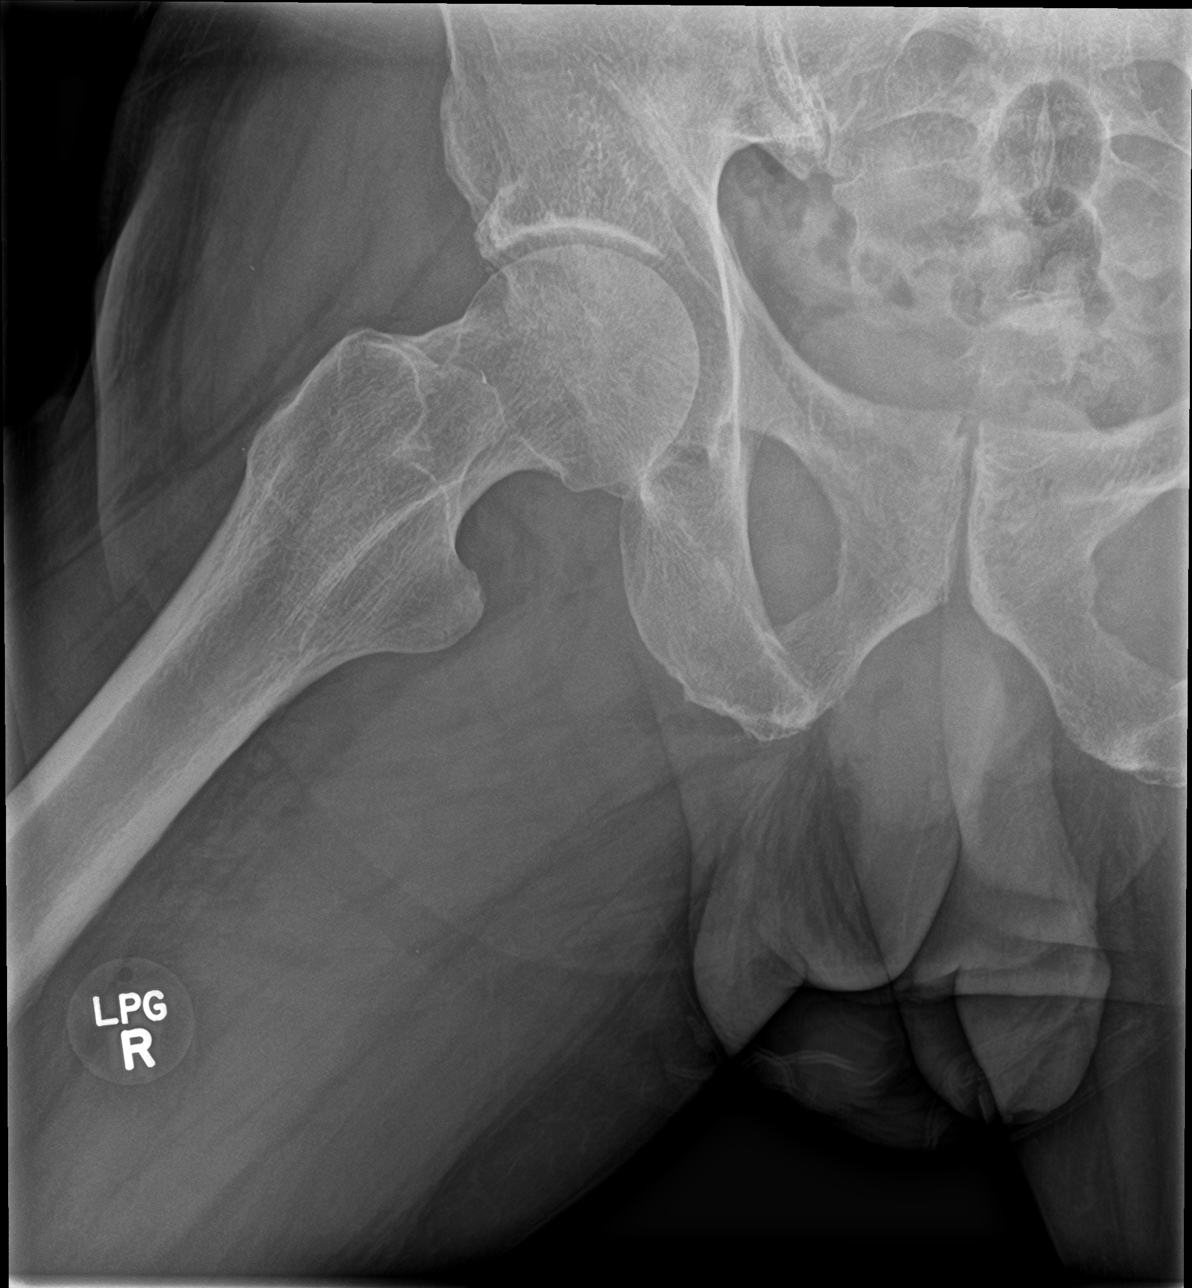

[3 of 3 positions shown; findings below may reference images not displayed]

FINDINGS: There is no evidence of hip fracture or dislocation. There is no
evidence of arthropathy or other focal bone abnormality.
IMPRESSION: Negative.

## 2023-02-17 IMAGING — DX DG LUMBAR SPINE COMPLETE 4+V
5 series · 5 of 5 positions shown · non-contrast
Comparison: None Available.

CLINICAL DATA: Right lower back pain after fall yesterday.

EXAM:
LUMBAR SPINE - COMPLETE 4+ VIEW

[l-spine ap]
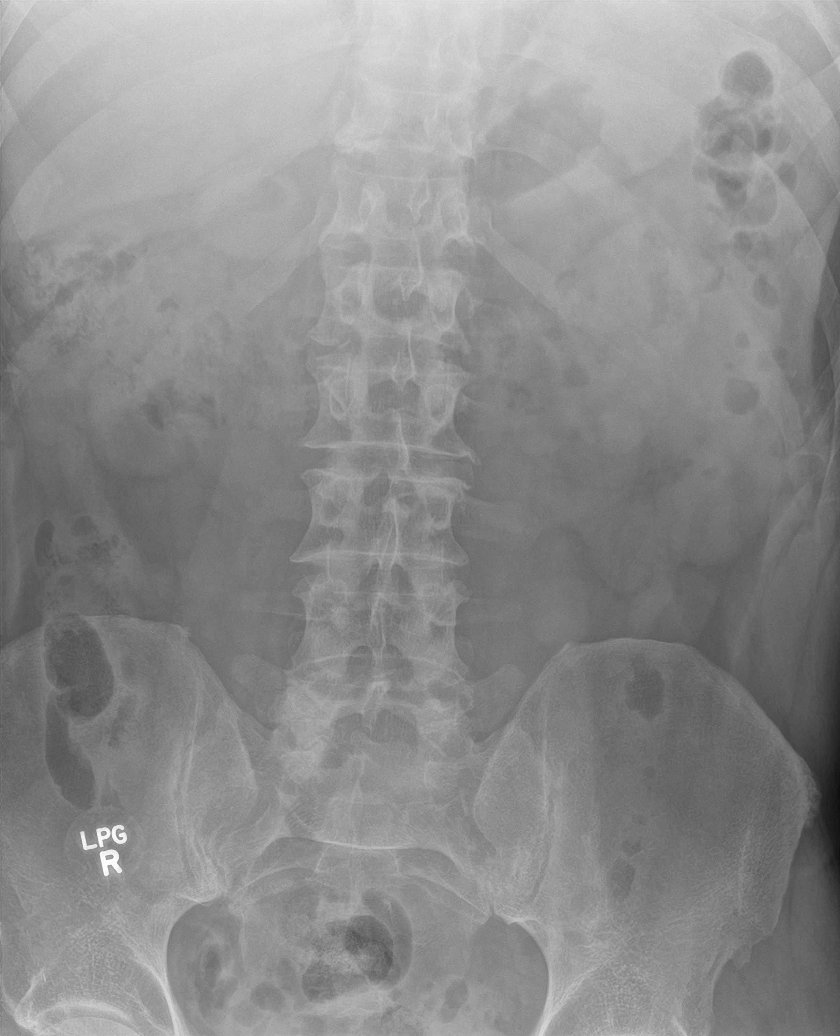

[l-spine obl (1 of 2)]
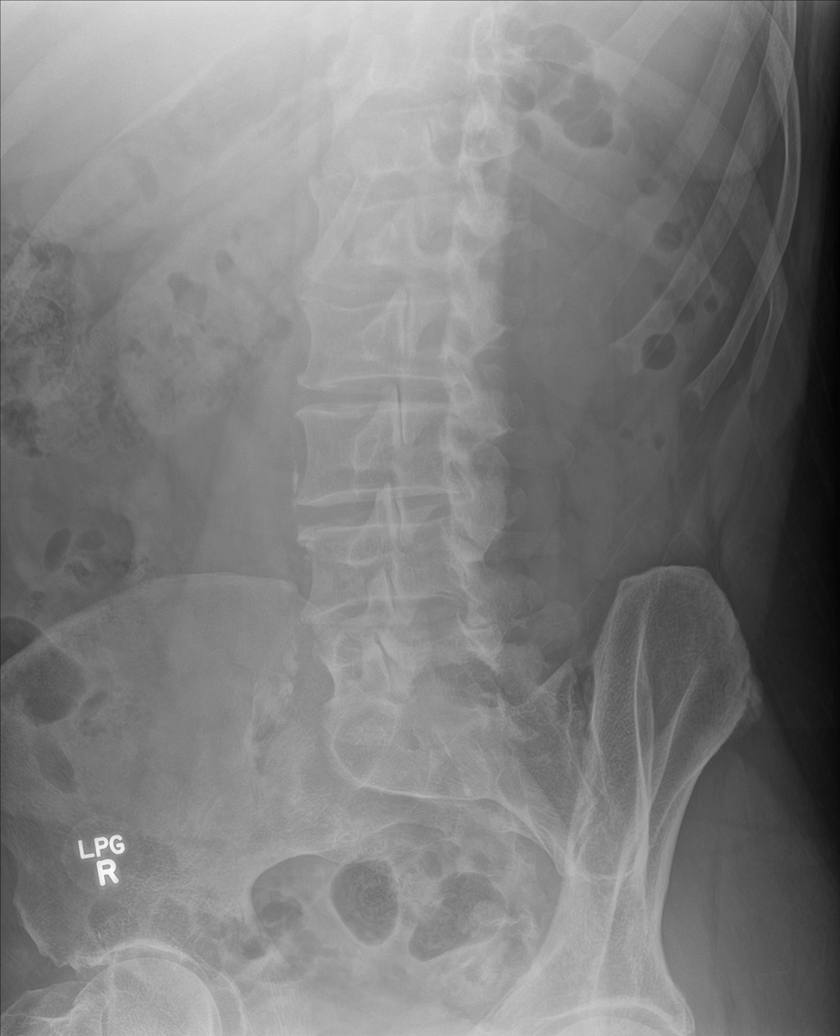

[l-spine obl (2 of 2)]
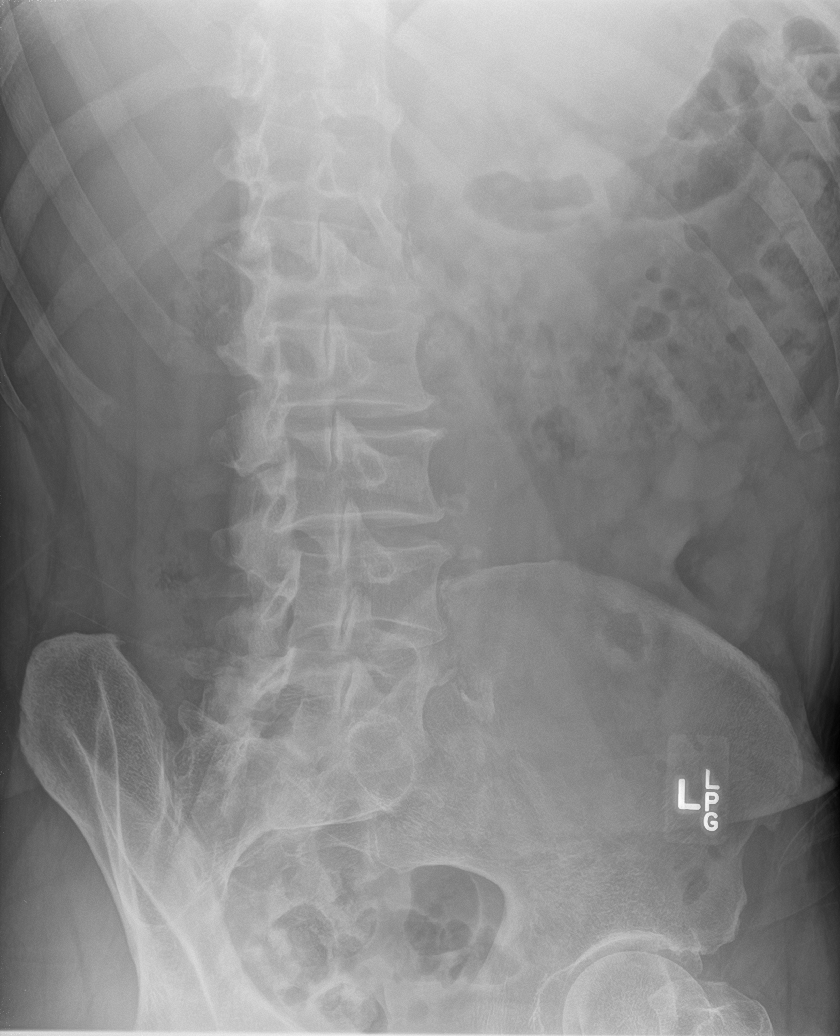

[l-spine lateral]
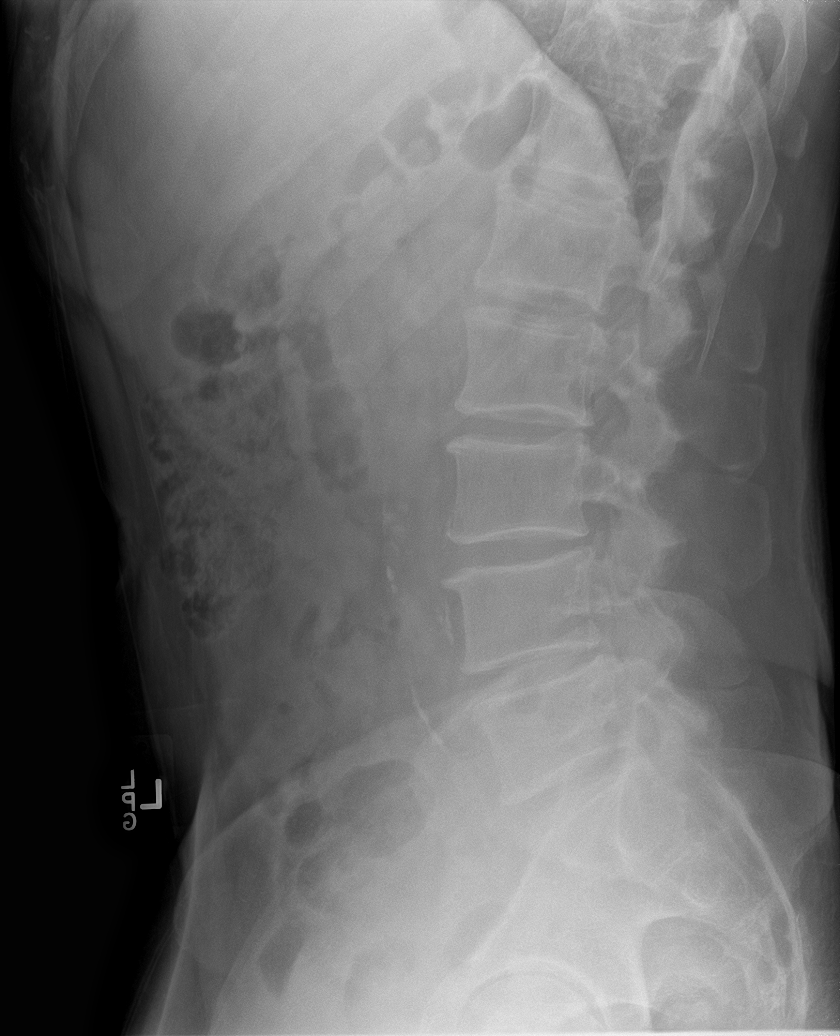

[l-spine spot]
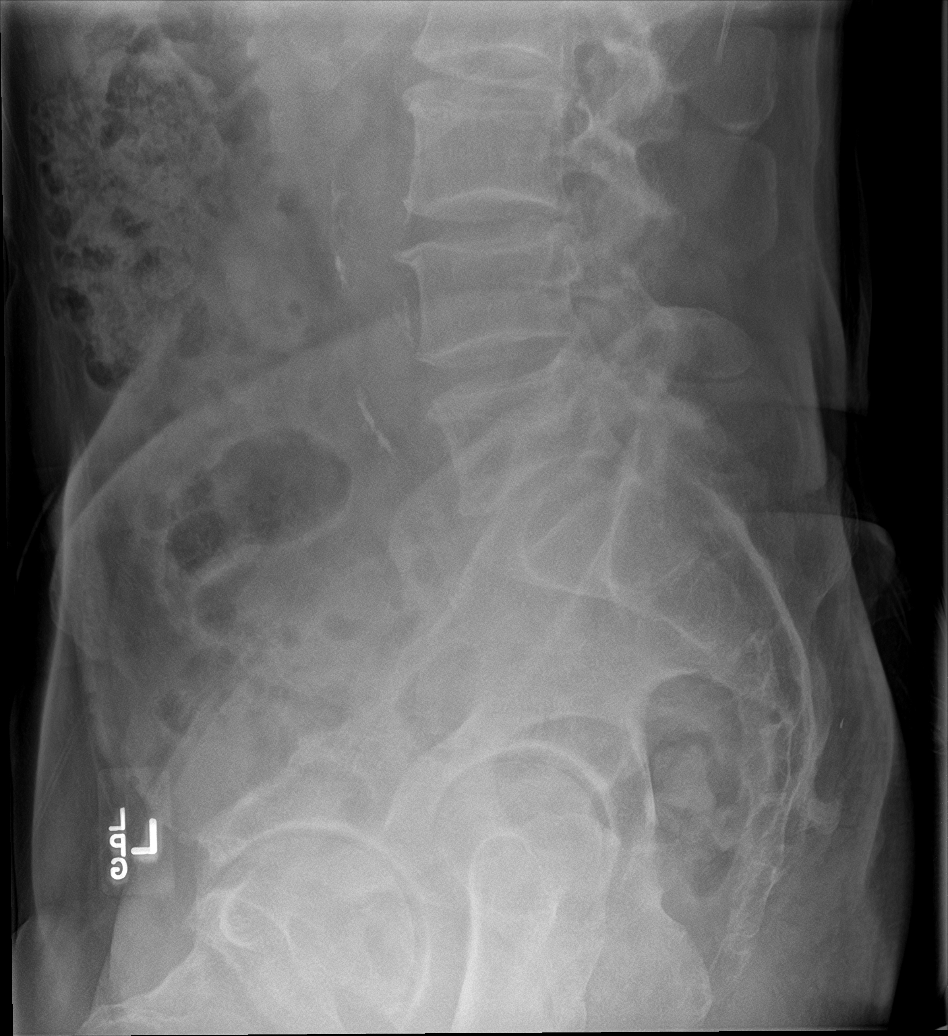

[5 of 5 positions shown; findings below may reference images not displayed]

FINDINGS: There is no evidence of lumbar spine fracture. Alignment is normal.
Mild degenerative disc disease is noted at L1-2, L2-3 and L3-4 with
anterior osteophyte formation.
IMPRESSION: Mild multilevel degenerative changes are noted. No acute abnormality
seen.

Aortic Atherosclerosis ([id number]).

## 2024-04-05 ENCOUNTER — Other Ambulatory Visit: Payer: Self-pay | Admitting: Physician Assistant

## 2024-04-05 DIAGNOSIS — R1011 Right upper quadrant pain: Secondary | ICD-10-CM

## 2024-04-17 ENCOUNTER — Inpatient Hospital Stay
Admission: RE | Admit: 2024-04-17 | Discharge: 2024-04-17 | Attending: Physician Assistant | Admitting: Physician Assistant

## 2024-04-17 DIAGNOSIS — R1011 Right upper quadrant pain: Secondary | ICD-10-CM
